# Patient Record
Sex: Female | Born: 1935
Health system: Southern US, Community
[De-identification: ages and names within clinical notes are randomized; demographics above are authoritative.]

## PROBLEM LIST (undated history)

## (undated) DIAGNOSIS — F329 Major depressive disorder, single episode, unspecified: Secondary | ICD-10-CM

## (undated) DIAGNOSIS — F419 Anxiety disorder, unspecified: Secondary | ICD-10-CM

## (undated) DIAGNOSIS — F32A Depression, unspecified: Secondary | ICD-10-CM

## (undated) HISTORY — DX: Anxiety disorder, unspecified: F41.9

## (undated) HISTORY — DX: Depression, unspecified: F32.A

## (undated) HISTORY — PX: TONSILLECTOMY: SUR1361

---
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
## 2013-03-11 DIAGNOSIS — F339 Major depressive disorder, recurrent, unspecified: Secondary | ICD-10-CM | POA: Insufficient documentation

## 2015-03-15 DIAGNOSIS — E039 Hypothyroidism, unspecified: Secondary | ICD-10-CM | POA: Insufficient documentation

## 2015-03-15 DIAGNOSIS — Z0000 Encounter for general adult medical examination without abnormal findings: Secondary | ICD-10-CM | POA: Insufficient documentation

## 2015-09-18 DIAGNOSIS — K580 Irritable bowel syndrome with diarrhea: Secondary | ICD-10-CM | POA: Insufficient documentation

## 2016-04-10 DIAGNOSIS — L719 Rosacea, unspecified: Secondary | ICD-10-CM | POA: Insufficient documentation

## 2016-09-20 DIAGNOSIS — Z800 Family history of malignant neoplasm of digestive organs: Secondary | ICD-10-CM | POA: Insufficient documentation

## 2016-12-25 DIAGNOSIS — R159 Full incontinence of feces: Secondary | ICD-10-CM | POA: Insufficient documentation

## 2017-04-23 DIAGNOSIS — E66811 Obesity, class 1: Secondary | ICD-10-CM | POA: Insufficient documentation

## 2017-04-23 DIAGNOSIS — E669 Obesity, unspecified: Secondary | ICD-10-CM | POA: Insufficient documentation

## 2018-08-03 DIAGNOSIS — E559 Vitamin D deficiency, unspecified: Secondary | ICD-10-CM | POA: Insufficient documentation

## 2018-10-15 ENCOUNTER — Encounter: Payer: Self-pay | Admitting: Adult Health

## 2018-10-15 ENCOUNTER — Ambulatory Visit (INDEPENDENT_AMBULATORY_CARE_PROVIDER_SITE_OTHER): Payer: Medicare Other | Admitting: Adult Health

## 2018-10-15 ENCOUNTER — Other Ambulatory Visit: Payer: Self-pay

## 2018-10-15 VITALS — Ht 64.0 in | Wt 179.0 lb

## 2018-10-15 DIAGNOSIS — F32A Depression, unspecified: Secondary | ICD-10-CM

## 2018-10-15 DIAGNOSIS — F411 Generalized anxiety disorder: Secondary | ICD-10-CM | POA: Diagnosis not present

## 2018-10-15 DIAGNOSIS — F329 Major depressive disorder, single episode, unspecified: Secondary | ICD-10-CM | POA: Diagnosis not present

## 2018-10-15 MED ORDER — FLUOXETINE HCL 20 MG PO CAPS: 20.0000 mg | ORAL_CAPSULE | Freq: Every day | ORAL | 1 refills | Status: DC

## 2018-10-15 MED ORDER — LORAZEPAM 0.5 MG PO TABS: ORAL_TABLET | ORAL | 2 refills | Status: DC

## 2018-10-15 NOTE — Progress Notes (Signed)
Ann Fredericknn M Majeed [id number] 1931/09/26 85 y.o.  Subjective:   Patient ID:  Ann Kim is a 85 y.o. (DOB 1931/09/26) female.  Chief Complaint:  Chief Complaint  Patient presents with  . Anxiety  . Depression    HPI Ann Kim presents to the office today for follow-up of depression and anxiety.  Describes mood today as "ok". Mood symptoms - denies depression, anxiety, and irritability. Stating "I'm doing alright for now". Has actually "enjoyed" the slow down of things since Covid-19. Had felt overwhelmed with increased responsibilities in her church community. Stating "i've been able to slow down and rest". Taking medications as prescribed and feels it works well for her.  Energy levels stable. Active, but does not have a regular exercise routine. Has issues with balance. Retired. Stable interest and motivation.  Enjoys some usual interests and activities. Mostly staying home. Getting out some days for errands. Connecting with family and friends. Daughters coming by once a week.  Appetite adequate. Weight gain - 8 pounds. [facility] not [facility]. Hoping to return at some point. Sleeps well most night. Averages 6 to 8 hours.  Focus and concentration stable. Completing tasks. Manages aspects of household.  Denies SI or HI.  Denies AH or VH.  Review of Systems:  Review of Systems  Musculoskeletal: Negative for gait problem.  Neurological: Negative for tremors.  Psychiatric/Behavioral:       Please refer to HPI    Medications: I have reviewed the patient's current medications.  Current Outpatient Medications  Medication Sig Dispense Refill  . doxycycline (PERIOSTAT) 20 MG tablet TK 1 T PO BID    . FLUoxetine (PROZAC) 20 MG capsule Take 1 capsule (20 mg total) by mouth daily. 90 capsule 1  . ketoconazole (NIZORAL) 2 % cream Take as directed    . levothyroxine (SYNTHROID) 88 MCG tablet TK 1 T PO D    . LORazepam (ATIVAN) 0.5 MG tablet Take one tablet daily as needed for  anxiety. 30 tablet 2  . METRONIDAZOLE, TOPICAL, 0.75 % LOTN APPLY AND RUB IN A THIN FILM TO AFFECTED AREA BID.  QAM AND QPM    . naproxen (NAPROSYN) 500 MG tablet Take by mouth.    . Cholecalciferol (VITAMIN D-1000 MAX ST) 25 MCG (1000 UT) tablet Take by mouth.     No current facility-administered medications for this visit.     Medication Side Effects: None  Allergies:  Allergies  Allergen Reactions  . Codeine Nausea Only  . Molds & Smuts Other (See Comments)  . Pollen Extract Other (See Comments)  . Penicillins Itching  . Sulfa Antibiotics Itching    Past Medical History:  Diagnosis Date  . Anxiety   . Depression     History reviewed. No pertinent family history.  Social History   Socioeconomic History  . Marital status: Widowed    Spouse name: Not on file  . Number of children: Not on file  . Years of education: Not on file  . Highest education level: Not on file  Occupational History  . Not on file  Social Needs  . Financial resource strain: Not on file  . Food insecurity    Worry: Not on file    Inability: Not on file  . Transportation needs    Medical: Not on file    Non-medical: Not on file  Tobacco Use  . Smoking status: Never Smoker  . Smokeless tobacco: Never Used  Substance and Sexual Activity  .  Alcohol use: Never    Frequency: Never  . Drug use: Never  . Sexual activity: Never  Lifestyle  . Physical activity    Days per week: Not on file    Minutes per session: Not on file  . Stress: Not on file  Relationships  . Social Herbalist on phone: Not on file    Gets together: Not on file    Attends religious service: Not on file    Active member of club or organization: Not on file    Attends meetings of clubs or organizations: Not on file    Relationship status: Not on file  . Intimate partner violence    Fear of current or ex partner: Not on file    Emotionally abused: Not on file    Physically abused: Not on file    Forced  sexual activity: Not on file  Other Topics Concern  . Not on file  Social History Narrative  . Not on file    Past Medical History, Surgical history, Social history, and Family history were reviewed and updated as appropriate.   Please see review of systems for further details on the patient's review from today.   Objective:   Physical Exam:  Ht 5\' 4"  (1.626 m)   Wt 179 lb (81.2 kg)   BMI 30.73 kg/m   Physical Exam Constitutional:      General: She is not in acute distress.    Appearance: She is well-developed.  Musculoskeletal:        General: No deformity.  Neurological:     Mental Status: She is alert and oriented to person, place, and time.     Coordination: Coordination normal.  Psychiatric:        Attention and Perception: Attention and perception normal. She does not perceive auditory or visual hallucinations.        Mood and Affect: Mood normal. Mood is not anxious or depressed. Affect is not labile, blunt, angry or inappropriate.        Speech: Speech normal.        Behavior: Behavior normal.        Thought Content: Thought content normal. Thought content is not paranoid or delusional. Thought content does not include homicidal or suicidal ideation. Thought content does not include homicidal or suicidal plan.        Cognition and Memory: Cognition and memory normal.        Judgment: Judgment normal.     Comments: Insight intact     Lab Review:  No results found for: NA, K, CL, CO2, GLUCOSE, BUN, CREATININE, CALCIUM, PROT, ALBUMIN, AST, ALT, ALKPHOS, BILITOT, GFRNONAA, GFRAA  No results found for: WBC, RBC, HGB, HCT, PLT, MCV, MCH, MCHC, RDW, LYMPHSABS, MONOABS, EOSABS, BASOSABS  No results found for: POCLITH, LITHIUM   No results found for: PHENYTOIN, PHENOBARB, VALPROATE, CBMZ   .res Assessment: Plan:    Plan:  1. Continue Prozac 20mg  capsule daily 2. Continue Ativan 0.5mg  daily prn anxiety - has used 6 times since January 2020  Discussed potential  benefits, risk, and side effects of benzodiazepines to include potential risk of tolerance and dependence, as well as possible drowsiness.  Advised patient not to drive if experiencing drowsiness and to take lowest possible effective dose to minimize risk of dependence and tolerance.  Patient advised to contact office with any questions, adverse effects, or acute worsening in signs and symptoms.  RTC 6 months  Leotta was seen today for  anxiety and depression.  Diagnoses and all orders for this visit:  Depression, unspecified depression type -     FLUoxetine (PROZAC) 20 MG capsule; Take 1 capsule (20 mg total) by mouth daily.  GAD (generalized anxiety disorder) -     LORazepam (ATIVAN) 0.5 MG tablet; Take one tablet daily as needed for anxiety. -     FLUoxetine (PROZAC) 20 MG capsule; Take 1 capsule (20 mg total) by mouth daily.     Please see After Visit Summary for patient specific instructions.  No future appointments.  No orders of the defined types were placed in this encounter.   -------------------------------

## 2019-04-19 ENCOUNTER — Ambulatory Visit: Payer: Medicare Other | Admitting: Adult Health

## 2019-04-22 ENCOUNTER — Ambulatory Visit (INDEPENDENT_AMBULATORY_CARE_PROVIDER_SITE_OTHER): Payer: Medicare Other | Admitting: Adult Health

## 2019-04-22 ENCOUNTER — Encounter: Payer: Self-pay | Admitting: Adult Health

## 2019-04-22 DIAGNOSIS — F411 Generalized anxiety disorder: Secondary | ICD-10-CM | POA: Diagnosis not present

## 2019-04-22 DIAGNOSIS — F329 Major depressive disorder, single episode, unspecified: Secondary | ICD-10-CM

## 2019-04-22 DIAGNOSIS — F32A Depression, unspecified: Secondary | ICD-10-CM

## 2019-04-22 MED ORDER — FLUOXETINE HCL 20 MG PO CAPS: 20.0000 mg | ORAL_CAPSULE | Freq: Every day | ORAL | 3 refills | Status: DC

## 2019-04-22 NOTE — Progress Notes (Signed)
Ann Kim [id number] Jun 05, 1931 86 y.o.  Virtual Visit via Telephone Note  I connected with pt on 04/22/19 at  1:40 PM EST by telephone and verified that I am speaking with the correct person using two identifiers.   I discussed the limitations, risks, security and privacy concerns of performing an evaluation and management service by telephone and the availability of in person appointments. I also discussed with the patient that there may be a patient responsible charge related to this service. The patient expressed understanding and agreed to proceed.   I discussed the assessment and treatment plan with the patient. The patient was provided an opportunity to ask questions and all were answered. The patient agreed with the plan and demonstrated an understanding of the instructions.   The patient was advised to call back or seek an in-person evaluation if the symptoms worsen or if the condition fails to improve as anticipated.  I provided 30 minutes of non-face-to-face time during this encounter.  The patient was located at home.  The provider was located at [facility].   Dorothyann Gibbs, NP   Subjective:   Patient ID:  CARIANNA LAGUE is a 86 y.o. (DOB [birth year]) female.  Chief Complaint: No chief complaint on file.   HPI Jaeanna Mccomber Takagi presents for follow-up of depression and anxiety.  Describes mood today as "ok". Pleasant. Mood symptoms - denies depression, anxiety, and irritability. Stating "I'm doing pretty good". Stating "I feel better than I've felt in a long time". Has been able to "relax" away from responsibilities of church setting. Stable interest and motivation. Taking medications as prescribed and feels it works well for her.  Energy levels stable. Active, but does not have a regular exercise routine. Retired.  Enjoys some usual interests and activities. Mostly staying home. Spending a few days with one of her daughters - "doesn't want to be alone in the ice  storm". Talking with family and friends.  Appetite adequate. Weight stable.   Sleeps well most night. Averages 6 to 8 hours.  Focus and concentration stable. Completing tasks. Manages aspects of household.  Denies SI or HI. Denies AH or VH.  Review of Systems:  Review of Systems  Musculoskeletal: Negative for gait problem.  Neurological: Negative for tremors.  Psychiatric/Behavioral:       Please refer to HPI    Medications: I have reviewed the patient's current medications.  Current Outpatient Medications  Medication Sig Dispense Refill  . Cholecalciferol (VITAMIN D-1000 MAX ST) 25 MCG (1000 UT) tablet Take by mouth.    . doxycycline (PERIOSTAT) 20 MG tablet TK 1 T PO BID    . FLUoxetine (PROZAC) 20 MG capsule Take 1 capsule (20 mg total) by mouth daily. 90 capsule 1  . ketoconazole (NIZORAL) 2 % cream Take as directed    . levothyroxine (SYNTHROID) 88 MCG tablet TK 1 T PO D    . LORazepam (ATIVAN) 0.5 MG tablet Take one tablet daily as needed for anxiety. 30 tablet 2  . METRONIDAZOLE, TOPICAL, 0.75 % LOTN APPLY AND RUB IN A THIN FILM TO AFFECTED AREA BID.  QAM AND QPM    . naproxen (NAPROSYN) 500 MG tablet Take by mouth.     No current facility-administered medications for this visit.    Medication Side Effects: None  Allergies:  Allergies  Allergen Reactions  . Codeine Nausea Only  . Molds & Smuts Other (See Comments)  . Pollen Extract Other (See Comments)  . Penicillins Itching  .  Sulfa Antibiotics Itching    Past Medical History:  Diagnosis Date  . Anxiety   . Depression     No family history on file.  Social History   Socioeconomic History  . Marital status: Widowed    Spouse name: Not on file  . Number of children: Not on file  . Years of education: Not on file  . Highest education level: Not on file  Occupational History  . Not on file  Tobacco Use  . Smoking status: Never Smoker  . Smokeless tobacco: Never Used  Substance and Sexual Activity  .  Alcohol use: Never  . Drug use: Never  . Sexual activity: Never  Other Topics Concern  . Not on file  Social History Narrative  . Not on file   Social Determinants of Health   Financial Resource Strain:   . Difficulty of Paying Living Expenses: Not on file  Food Insecurity:   . Worried About Programme researcher, broadcasting/film/video in the Last Year: Not on file  . Ran Out of Food in the Last Year: Not on file  Transportation Needs:   . Lack of Transportation (Medical): Not on file  . Lack of Transportation (Non-Medical): Not on file  Physical Activity:   . Days of Exercise per Week: Not on file  . Minutes of Exercise per Session: Not on file  Stress:   . Feeling of Stress : Not on file  Social Connections:   . Frequency of Communication with Friends and Family: Not on file  . Frequency of Social Gatherings with Friends and Family: Not on file  . Attends Religious Services: Not on file  . Active Member of Clubs or Organizations: Not on file  . Attends Banker Meetings: Not on file  . Marital Status: Not on file  Intimate Partner Violence:   . Fear of Current or Ex-Partner: Not on file  . Emotionally Abused: Not on file  . Physically Abused: Not on file  . Sexually Abused: Not on file    Past Medical History, Surgical history, Social history, and Family history were reviewed and updated as appropriate.   Please see review of systems for further details on the patient's review from today.   Objective:   Physical Exam:  There were no vitals taken for this visit.  Physical Exam Constitutional:      General: She is not in acute distress.    Appearance: She is well-developed.  Musculoskeletal:        General: No deformity.  Neurological:     Mental Status: She is alert and oriented to person, place, and time.     Coordination: Coordination normal.  Psychiatric:        Attention and Perception: Attention and perception normal. She does not perceive auditory or visual  hallucinations.        Mood and Affect: Mood normal. Mood is not anxious or depressed. Affect is not labile, blunt, angry or inappropriate.        Speech: Speech normal.        Behavior: Behavior normal.        Thought Content: Thought content normal. Thought content is not paranoid or delusional. Thought content does not include homicidal or suicidal ideation. Thought content does not include homicidal or suicidal plan.        Cognition and Memory: Cognition and memory normal.        Judgment: Judgment normal.     Comments: Insight intact  Lab Review:  No results found for: NA, K, CL, CO2, GLUCOSE, BUN, CREATININE, CALCIUM, PROT, ALBUMIN, AST, ALT, ALKPHOS, BILITOT, GFRNONAA, GFRAA  No results found for: WBC, RBC, HGB, HCT, PLT, MCV, MCH, MCHC, RDW, LYMPHSABS, MONOABS, EOSABS, BASOSABS  No results found for: POCLITH, LITHIUM   No results found for: PHENYTOIN, PHENOBARB, VALPROATE, CBMZ   .res Assessment: Plan:    Plan:  1. Continue Prozac 20mg  capsule daily 2. Continue Ativan 0.5mg  daily prn anxiety - has used 6 times since January 2020  Discussed potential benefits, risk, and side effects of benzodiazepines to include potential risk of tolerance and dependence, as well as possible drowsiness.  Advised patient not to drive if experiencing drowsiness and to take lowest possible effective dose to minimize risk of dependence and tolerance.  Patient advised to contact office with any questions, adverse effects, or acute worsening in signs and symptoms.  RTC 6 months  There are no diagnoses linked to this encounter.  Please see After Visit Summary for patient specific instructions.  Future Appointments  Date Time Provider [place]  04/22/2019  1:40 PM Kerriann Kamphuis, Berdie Ogren, NP [facility] None    No orders of the defined types were placed in this encounter.     -------------------------------

## 2019-10-20 ENCOUNTER — Other Ambulatory Visit: Payer: Self-pay | Admitting: Adult Health

## 2019-10-20 DIAGNOSIS — F411 Generalized anxiety disorder: Secondary | ICD-10-CM

## 2019-10-20 DIAGNOSIS — F32A Depression, unspecified: Secondary | ICD-10-CM

## 2019-10-20 DIAGNOSIS — F329 Major depressive disorder, single episode, unspecified: Secondary | ICD-10-CM

## 2019-10-20 MED ORDER — FLUOXETINE HCL 20 MG PO CAPS: 20.0000 mg | ORAL_CAPSULE | Freq: Every day | ORAL | 3 refills | Status: DC

## 2019-10-25 ENCOUNTER — Telehealth: Payer: Self-pay | Admitting: Adult Health

## 2019-10-25 NOTE — Telephone Encounter (Signed)
Last visit 04/2019  Okay to send updated information for refill>

## 2019-10-25 NOTE — Telephone Encounter (Signed)
Yes

## 2019-10-25 NOTE — Telephone Encounter (Signed)
Pt called to report she needs new Rx for Prozac 20 mg 2/d she takes  1-am & 1-pm. Has been taking 2/d for about 6 mos now and just wants to get Rx corrected so she will not run out too soon. [facility] [address] on file. Call pt [phone] if any questions   Pt doesn't want 40 mg 1/d Rx)

## 2019-10-26 ENCOUNTER — Other Ambulatory Visit: Payer: Self-pay

## 2019-10-26 DIAGNOSIS — F32A Depression, unspecified: Secondary | ICD-10-CM

## 2019-10-26 DIAGNOSIS — F411 Generalized anxiety disorder: Secondary | ICD-10-CM

## 2019-10-26 MED ORDER — FLUOXETINE HCL 20 MG PO CAPS: 20.0000 mg | ORAL_CAPSULE | Freq: Two times a day (BID) | ORAL | 1 refills | Status: DC

## 2019-10-26 NOTE — Telephone Encounter (Signed)
Rx updated and sent.

## 2020-01-24 ENCOUNTER — Ambulatory Visit: Payer: Medicare Other | Attending: Internal Medicine

## 2020-01-24 ENCOUNTER — Other Ambulatory Visit (HOSPITAL_BASED_OUTPATIENT_CLINIC_OR_DEPARTMENT_OTHER): Payer: Self-pay | Admitting: Internal Medicine

## 2020-01-24 DIAGNOSIS — Z23 Encounter for immunization: Secondary | ICD-10-CM

## 2020-01-24 MED FILL — MODERNA COVID-19 VACCINE 10: 100 | 1 days supply | Qty: 0 | Fill #0

## 2020-01-24 MED FILL — FLUAD QUADRIVALENT 0.5 ML P: 0.5 | 1 days supply | Qty: 1 | Fill #0

## 2020-01-24 NOTE — Progress Notes (Signed)
   Covid-[facility] Vaccination [facility]  Name:  BRYNLEI KLAUSNER    MRN: [id number] DOB: 17-Apr-1931  01/24/2020  Ms. Hageman was observed post Covid-19 immunization for 15 minutes without incident. She was provided with Vaccine Information Sheet and instruction to access the V-Safe system.   Ms. Linenberger was instructed to call 911 with any severe reactions post vaccine: Marland Kitchen Difficulty breathing  . Swelling of face and throat  . A fast heartbeat  . A bad rash all over body  . Dizziness and weakness   Immunizations Administered    No immunizations on file.     Vaccine given by Arletha Pili, pharmacy student

## 2020-02-15 ENCOUNTER — Other Ambulatory Visit: Payer: Self-pay

## 2020-02-15 ENCOUNTER — Encounter: Payer: Self-pay | Admitting: Adult Health

## 2020-02-15 ENCOUNTER — Ambulatory Visit (INDEPENDENT_AMBULATORY_CARE_PROVIDER_SITE_OTHER): Payer: Medicare Other | Admitting: Adult Health

## 2020-02-15 DIAGNOSIS — F411 Generalized anxiety disorder: Secondary | ICD-10-CM

## 2020-02-15 DIAGNOSIS — F32A Depression, unspecified: Secondary | ICD-10-CM

## 2020-02-15 MED ORDER — FLUOXETINE HCL 10 MG PO CAPS: ORAL_CAPSULE | ORAL | 5 refills | Status: DC

## 2020-02-15 MED ORDER — FLUOXETINE HCL 20 MG PO CAPS: ORAL_CAPSULE | ORAL | 5 refills | Status: DC

## 2020-02-15 MED ORDER — LORAZEPAM 0.5 MG PO TABS: ORAL_TABLET | ORAL | 2 refills | Status: DC

## 2020-02-15 MED ORDER — BUPROPION HCL ER (SR) 100 MG PO TB12: 100.0000 mg | ORAL_TABLET | Freq: Every morning | ORAL | 5 refills | Status: DC

## 2020-02-15 NOTE — Progress Notes (Signed)
Ann Kim [id number] 24-Apr-1931 86 y.o.  Subjective:   Patient ID:  Ann Kim is a 86 y.o. (DOB 1931/07/28) female.  Chief Complaint: No chief complaint on file.   HPI Ann Kim presents to the office today for follow-up of depression and anxiety.  Describes mood today as "not so good". Pleasant. Mood symptoms - reports depression, anxiety, and irritability. Stating "I'm not myself". Daughters wanted her to come in. They feel like she is depressed and stressed with church obligations. Stable interest and motivation. Taking medications as prescribed and feels it works well for her.  Energy levels stable. Active, but does not have a regular exercise routine. Retired.  Enjoys some usual interests and activities. Lives alone. Involved in church. Getting out some. Talking with family and friends.  Appetite adequate. Weight stable.   Sleeps well most night. Averages 6 to 8 hours.  Focus and concentration stable. Completing tasks. Manages aspects of household. Retired. Denies SI or HI.  Denies AH or VH.  Review of Systems:  Review of Systems  Musculoskeletal: Negative for gait problem.  Neurological: Negative for tremors.  Psychiatric/Behavioral:       Please refer to HPI    Medications: I have reviewed the patient's current medications.  Current Outpatient Medications  Medication Sig Dispense Refill  . buPROPion (WELLBUTRIN SR) 100 MG 12 hr tablet Take 1 tablet (100 mg total) by mouth in the morning. 30 tablet 5  . Cholecalciferol (VITAMIN D-1000 MAX ST) 25 MCG (1000 UT) tablet Take by mouth.    . doxycycline (PERIOSTAT) 20 MG tablet TK 1 T PO BID    . FLUoxetine (PROZAC) 20 MG capsule Take one capsule twice daily. 60 capsule 5  . ketoconazole (NIZORAL) 2 % cream Take as directed    . levothyroxine (SYNTHROID) 88 MCG tablet TK 1 T PO D    . LORazepam (ATIVAN) 0.5 MG tablet Take one tablet daily as needed for anxiety. 30 tablet 2  . METRONIDAZOLE, TOPICAL, 0.75 % LOTN APPLY  AND RUB IN A THIN FILM TO AFFECTED AREA BID.  QAM AND QPM    . naproxen (NAPROSYN) 500 MG tablet Take by mouth.     No current facility-administered medications for this visit.    Medication Side Effects: None  Allergies:  Allergies  Allergen Reactions  . Codeine Nausea Only  . Molds & Smuts Other (See Comments)  . Pollen Extract Other (See Comments)  . Penicillins Itching  . Sulfa Antibiotics Itching    Past Medical History:  Diagnosis Date  . Anxiety   . Depression     No family history on file.  Social History   Socioeconomic History  . Marital status: Widowed    Spouse name: Not on file  . Number of children: Not on file  . Years of education: Not on file  . Highest education level: Not on file  Occupational History  . Not on file  Tobacco Use  . Smoking status: Never Smoker  . Smokeless tobacco: Never Used  Substance and Sexual Activity  . Alcohol use: Never  . Drug use: Never  . Sexual activity: Never  Other Topics Concern  . Not on file  Social History Narrative  . Not on file   Social Determinants of Health   Financial Resource Strain: Not on file  Food Insecurity: Not on file  Transportation Needs: Not on file  Physical Activity: Not on file  Stress: Not on file  Social Connections: Not on file  Intimate Partner Violence: Not on file    Past Medical History, Surgical history, Social history, and Family history were reviewed and updated as appropriate.   Please see review of systems for further details on the patient's review from today.   Objective:   Physical Exam:  There were no vitals taken for this visit.  Physical Exam Constitutional:      General: She is not in acute distress. Musculoskeletal:        General: No deformity.  Neurological:     Mental Status: She is alert and oriented to person, place, and time.     Coordination: Coordination normal.  Psychiatric:        Attention and Perception: Attention and perception normal.  She does not perceive auditory or visual hallucinations.        Mood and Affect: Mood normal. Mood is not anxious or depressed. Affect is not labile, blunt, angry or inappropriate.        Speech: Speech normal.        Behavior: Behavior normal.        Thought Content: Thought content normal. Thought content is not paranoid or delusional. Thought content does not include homicidal or suicidal ideation. Thought content does not include homicidal or suicidal plan.        Cognition and Memory: Cognition and memory normal.        Judgment: Judgment normal.     Comments: Insight intact     Lab Review:  No results found for: NA, K, CL, CO2, GLUCOSE, BUN, CREATININE, CALCIUM, PROT, ALBUMIN, AST, ALT, ALKPHOS, BILITOT, GFRNONAA, GFRAA  No results found for: WBC, RBC, HGB, HCT, PLT, MCV, MCH, MCHC, RDW, LYMPHSABS, MONOABS, EOSABS, BASOSABS  No results found for: POCLITH, LITHIUM   No results found for: PHENYTOIN, PHENOBARB, VALPROATE, CBMZ   .res Assessment: Plan:    Plan:  1. Continue Prozac 20mg  BID 2. Continue Ativan 0.5mg  daily prn anxiety 3. Add Wellbutrin SR 100mg  every morning  Discussed potential benefits, risk, and side effects of benzodiazepines to include potential risk of tolerance and dependence, as well as possible drowsiness.  Advised patient not to drive if experiencing drowsiness and to take lowest possible effective dose to minimize risk of dependence and tolerance.  Patient advised to contact office with any questions, adverse effects, or acute worsening in signs and symptoms.  RTC 6 months  Diagnoses and all orders for this visit:  Depression, unspecified depression type -     Discontinue: FLUoxetine (PROZAC) 10 MG capsule; Take three capsules daily. -     FLUoxetine (PROZAC) 20 MG capsule; Take one capsule twice daily. -     buPROPion (WELLBUTRIN SR) 100 MG 12 hr tablet; Take 1 tablet (100 mg total) by mouth in the morning.  GAD (generalized anxiety disorder) -      Discontinue: FLUoxetine (PROZAC) 10 MG capsule; Take three capsules daily. -     Discontinue: LORazepam (ATIVAN) 0.5 MG tablet; Take one tablet daily as needed for anxiety. -     FLUoxetine (PROZAC) 20 MG capsule; Take one capsule twice daily. -     buPROPion (WELLBUTRIN SR) 100 MG 12 hr tablet; Take 1 tablet (100 mg total) by mouth in the morning. -     LORazepam (ATIVAN) 0.5 MG tablet; Take one tablet daily as needed for anxiety.     Please see After Visit Summary for patient specific instructions.  No future appointments.  No orders of the defined types were placed in this encounter.   -------------------------------

## 2020-02-21 ENCOUNTER — Other Ambulatory Visit: Payer: Self-pay

## 2020-02-21 ENCOUNTER — Emergency Department (HOSPITAL_BASED_OUTPATIENT_CLINIC_OR_DEPARTMENT_OTHER)
Admission: EM | Admit: 2020-02-21 | Discharge: 2020-02-21 | Disposition: A | Discharge status: Home / Self Care | Payer: Medicare Other | Attending: Emergency Medicine | Admitting: Emergency Medicine

## 2020-02-21 ENCOUNTER — Emergency Department (HOSPITAL_BASED_OUTPATIENT_CLINIC_OR_DEPARTMENT_OTHER): Payer: Medicare Other

## 2020-02-21 ENCOUNTER — Encounter (HOSPITAL_BASED_OUTPATIENT_CLINIC_OR_DEPARTMENT_OTHER): Payer: Self-pay | Admitting: *Deleted

## 2020-02-21 DIAGNOSIS — E039 Hypothyroidism, unspecified: Secondary | ICD-10-CM | POA: Insufficient documentation

## 2020-02-21 DIAGNOSIS — S80211A Abrasion, right knee, initial encounter: Secondary | ICD-10-CM | POA: Insufficient documentation

## 2020-02-21 DIAGNOSIS — S0031XA Abrasion of nose, initial encounter: Secondary | ICD-10-CM | POA: Diagnosis not present

## 2020-02-21 DIAGNOSIS — S80212A Abrasion, left knee, initial encounter: Secondary | ICD-10-CM | POA: Diagnosis not present

## 2020-02-21 DIAGNOSIS — S0081XA Abrasion of other part of head, initial encounter: Secondary | ICD-10-CM | POA: Insufficient documentation

## 2020-02-21 DIAGNOSIS — R42 Dizziness and giddiness: Secondary | ICD-10-CM | POA: Diagnosis not present

## 2020-02-21 DIAGNOSIS — Y9301 Activity, walking, marching and hiking: Secondary | ICD-10-CM | POA: Diagnosis not present

## 2020-02-21 DIAGNOSIS — W19XXXA Unspecified fall, initial encounter: Secondary | ICD-10-CM

## 2020-02-21 DIAGNOSIS — S0993XA Unspecified injury of face, initial encounter: Secondary | ICD-10-CM | POA: Diagnosis present

## 2020-02-21 DIAGNOSIS — W102XXA Fall (on)(from) incline, initial encounter: Secondary | ICD-10-CM | POA: Diagnosis not present

## 2020-02-21 LAB — COMPREHENSIVE METABOLIC PANEL
ALT: 25 U/L (ref 0–44)
AST: 30 U/L (ref 15–41)
Albumin: 3.8 g/dL (ref 3.5–5.0)
Alkaline Phosphatase: 68 U/L (ref 38–126)
Anion gap: 12 (ref 5–15)
BUN: 19 mg/dL (ref 8–23)
CO2: 24 mmol/L (ref 22–32)
Calcium: 9.4 mg/dL (ref 8.9–10.3)
Chloride: 102 mmol/L (ref 98–111)
Creatinine, Ser: 0.83 mg/dL (ref 0.44–1.00)
GFR, Estimated: >60 mL/min (ref >60)
Glucose, Bld: 107 mg/dL — ABNORMAL HIGH (ref 70–99)
Potassium: 4.7 mmol/L (ref 3.5–5.1)
Sodium: 138 mmol/L (ref 135–145)
Total Bilirubin: 0.2 mg/dL — ABNORMAL LOW (ref 0.3–1.2)
Total Protein: 6.8 g/dL (ref 6.5–8.1)

## 2020-02-21 LAB — CBC WITH DIFFERENTIAL/PLATELET
Abs Immature Granulocytes: 0.02 10*3/uL (ref 0.00–0.07)
Basophils Absolute: 0 10*3/uL (ref 0.0–0.1)
Basophils Relative: 1 %
Eosinophils Absolute: 0.2 10*3/uL (ref 0.0–0.5)
Eosinophils Relative: 2 %
HCT: 42.9 % (ref 36.0–46.0)
Hemoglobin: 14.3 g/dL (ref 12.0–15.0)
Immature Granulocytes: 0 %
Lymphocytes Relative: 22 %
Lymphs Abs: 1.7 10*3/uL (ref 0.7–4.0)
MCH: 32.1 pg (ref 26.0–34.0)
MCHC: 33.3 g/dL (ref 30.0–36.0)
MCV: 96.2 fL (ref 80.0–100.0)
Monocytes Absolute: 0.6 10*3/uL (ref 0.1–1.0)
Monocytes Relative: 8 %
Neutro Abs: 5.4 10*3/uL (ref 1.7–7.7)
Neutrophils Relative %: 67 %
Platelets: 264 10*3/uL (ref 150–400)
RBC: 4.46 MIL/uL (ref 3.87–5.11)
RDW: 13.4 % (ref 11.5–15.5)
WBC: 7.9 10*3/uL (ref 4.0–10.5)
nRBC: 0 % (ref 0.0–0.2)

## 2020-02-21 NOTE — ED Triage Notes (Addendum)
C/o fall from standing x 2 hrs , abrasion to forehead and left side of face, also c/o dizziness/ and unsteady gate  this am

## 2020-02-22 NOTE — ED Provider Notes (Signed)
[facility] EMERGENCY DEPARTMENT Provider Note   CSN: [id number] Arrival date & time: 02/21/20  1655     History Chief Complaint  Patient presents with  . Fall    Ann Kim is a 86 y.o. female.  Presents to ER after a fall.  Patient reports that she was walking outside down a ramp and felt like she got unsteady and could not catch herself and fell to her face.  Denies loss of consciousness.  Denies dizziness, lightheadedness.  Has been able to walk since the incident without difficulty.  Reports that she has been feeling well overall lately.  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  HPI     Past Medical History:  Diagnosis Date  . Anxiety   . Depression     Patient Active Problem List   Diagnosis Date Noted  . Vitamin D deficiency 08/03/2018  . Obesity (BMI 30.0-34.9) 04/23/2017  . Incontinence of feces 12/25/2016  . Family history of colon cancer 09/20/2016  . Rosacea 04/10/2016  . Irritable bowel syndrome with diarrhea 09/18/2015  . Acquired hypothyroidism 03/15/2015  . Encounter for Medicare annual wellness exam 03/15/2015  . Major depressive disorder, recurrent, unspecified (HCC) 03/11/2013    Past Surgical History:  Procedure Laterality Date  . TONSILLECTOMY       OB History   No obstetric history on file.     No family history on file.  Social History   Tobacco Use  . Smoking status: Never Smoker  . Smokeless tobacco: Never Used  Substance Use Topics  . Alcohol use: Never  . Drug use: Never    Home Medications Prior to Admission medications   Medication Sig Start Date End Date Taking? Authorizing Provider  buPROPion (WELLBUTRIN SR) 100 MG 12 hr tablet Take 1 tablet (100 mg total) by mouth in the morning. 02/15/20   Mozingo, Thereasa Solo, NP  Cholecalciferol (VITAMIN D-1000 MAX ST) 25 MCG (1000 UT) tablet Take by mouth.    [provider]  doxycycline (PERIOSTAT) 20 MG  tablet TK 1 T PO BID 08/25/18   [provider]  FLUoxetine (PROZAC) 20 MG capsule Take one capsule twice daily. 02/15/20   Mozingo, Thereasa Solo, NP  ketoconazole (NIZORAL) 2 % cream Take as directed 01/17/14   [provider]  levothyroxine (SYNTHROID) 88 MCG tablet TK 1 T PO D 09/09/18   [provider]  LORazepam (ATIVAN) 0.5 MG tablet Take one tablet daily as needed for anxiety. 02/15/20   Mozingo, Thereasa Solo, NP  METRONIDAZOLE, TOPICAL, 0.75 % LOTN APPLY AND RUB IN A THIN FILM TO AFFECTED AREA BID.  QAM AND QPM 01/12/16   [provider]  naproxen (NAPROSYN) 500 MG tablet Take by mouth.    [provider]    Allergies    Codeine, Molds & smuts, Pollen extract, Penicillins, and Sulfa antibiotics  Review of Systems   Review of Systems  Constitutional: Negative for chills and fever.  HENT: Negative for ear pain and sore throat.   Eyes: Negative for pain and visual disturbance.  Respiratory: Negative for cough and shortness of breath.   Cardiovascular: Negative for chest pain and palpitations.  Gastrointestinal: Negative for abdominal pain and vomiting.  Genitourinary: Negative for dysuria and hematuria.  Musculoskeletal: Negative for arthralgias and back pain.  Skin: Negative for color change and rash.  Neurological: Negative for seizures and syncope.  All other systems reviewed and are negative.   Physical  Exam Updated Vital Signs BP (!) 147/66 (BP Location: Right Arm)   Pulse 64   Temp 97.9 F (36.6 C) (Oral)   Resp 15   Ht 5\' 3"  (1.6 m)   Wt 83.5 kg   SpO2 99%   BMI 32.59 kg/m   Physical Exam Vitals and nursing note reviewed.  Constitutional:      General: She is not in acute distress.    Appearance: She is well-developed and well-nourished.  HENT:     Head: Normocephalic.     Comments: Superficial abrasion to forehead, left nose, no palpable bony deformity, normal EOM Eyes:     Conjunctiva/sclera: Conjunctivae  normal.  Cardiovascular:     Rate and Rhythm: Normal rate and regular rhythm.     Heart sounds: No murmur heard.   Pulmonary:     Effort: Pulmonary effort is normal. No respiratory distress.     Breath sounds: Normal breath sounds.  Abdominal:     Palpations: Abdomen is soft.     Tenderness: There is no abdominal tenderness.  Musculoskeletal:        General: No edema.     Cervical back: Normal range of motion and neck supple.     Comments: Back: no C, T, L spine TTP, no step off or deformity RUE: no TTP throughout, no deformity, normal joint ROM, radial pulse intact, distal sensation and motor intact LUE: no TTP throughout, no deformity, normal joint ROM, radial pulse intact, distal sensation and motor intact RLE: Mild superficial abrasion to anterior knee, no TTP throughout, normal joint ROM, distal pulse, sensation and motor intact LLE: Mild superficial abrasion to anterior knee, no TTP throughout, normal joint ROM, distal pulse, sensation and motor intact   Skin:    General: Skin is warm and dry.  Neurological:     Mental Status: She is alert.  Psychiatric:        Mood and Affect: Mood and affect normal.     ED Results / Procedures / Treatments   Labs (all labs ordered are listed, but only abnormal results are displayed) Labs Reviewed  COMPREHENSIVE METABOLIC PANEL - Abnormal; Notable for the following components:      Result Value   Glucose, Bld 107 (*)    Total Bilirubin 0.2 (*)    All other components within normal limits  CBC WITH DIFFERENTIAL/PLATELET    EKG None  Radiology CT Head Wo Contrast  Result Date: 02/21/2020 CLINICAL DATA:  Dizziness. Nonspecific. Fall from standing 2 hours ago. EXAM: CT HEAD WITHOUT CONTRAST TECHNIQUE: Contiguous axial images were obtained from the base of the skull through the vertex without intravenous contrast. COMPARISON:  None. FINDINGS: Brain: Cerebral ventricle sizes are concordant with the degree of cerebral volume loss.  Patchy and confluent areas of decreased attenuation are noted throughout the deep and periventricular white matter of the cerebral hemispheres bilaterally, compatible with chronic microvascular ischemic disease. No evidence of large-territorial acute infarction. No parenchymal hemorrhage. No mass lesion. No extra-axial collection. No mass effect or midline shift. No hydrocephalus. Basilar cisterns are patent. Atherosclerotic calcifications are present within the cavernous internal carotid arteries. Vascular: No hyperdense vessel. Skull: No acute fracture or focal lesion. Sinuses/Orbits: Paranasal sinuses and mastoid air cells are clear. Bilateral LEs thinning. Otherwise the orbits are unremarkable. Other: Left frontal subcutaneus soft tissue scalp edema with no large hematoma formation. IMPRESSION: No acute intracranial abnormality. Electronically Signed   By: 02/23/2020 M.D.   On: 02/21/2020 18:25    Procedures Procedures (  including critical care time)  Medications Ordered in ED Medications - No data to display  ED Course  I have reviewed the triage vital signs and the nursing notes.  Pertinent labs & imaging results that were available during my care of the patient were reviewed by me and considered in my medical decision making (see chart for details).    MDM Rules/Calculators/A&P                         86 year old lady presents to ER after a fall.  On exam well-appearing, no ongoing symptoms.  Noted superficial abrasion to face, no palpable deformity noted, no significant laceration requiring repair.  CT head negative.  On remainder of exam, noted very superficial abrasion over her knees however no focal bony tenderness and patient ambulatory without difficulty.  Wound care was provided in ER and patient discharged home.  Recommend follow-up with primary.  After the discussed management above, the patient was determined to be safe for discharge.  The patient was in agreement with this  plan and all questions regarding their care were answered.  ED return precautions were discussed and the patient will return to the ED with any significant worsening of condition.    Final Clinical Impression(s) / ED Diagnoses Final diagnoses:  Fall, initial encounter    Rx / DC Orders ED Discharge Orders    None       Milagros Loll, MD 02/22/20 1520

## 2020-03-13 ENCOUNTER — Telehealth: Payer: Self-pay | Admitting: Adult Health

## 2020-03-13 NOTE — Telephone Encounter (Signed)
Noted  

## 2020-03-13 NOTE — Telephone Encounter (Signed)
Pt is asking for a telephone call re: her meds.

## 2020-05-01 ENCOUNTER — Telehealth: Payer: Self-pay | Admitting: Adult Health

## 2020-05-01 NOTE — Telephone Encounter (Signed)
Ann Kim called to request refill of her Prozac and I told her to have the pharmacy send the request.  Then she said she was supposed to check back in with about the addition of another medication and how her balance is doing.  She is walking with cane.  But she requested a call back to just update you on her progress. Has appt 6/14.  You can either phone #.  If you reach her on the home #, call her cell.

## 2020-05-01 NOTE — Telephone Encounter (Signed)
Called and LVM for patient to return call. 

## 2020-08-15 ENCOUNTER — Ambulatory Visit: Payer: Medicare Other | Admitting: Adult Health

## 2020-08-15 ENCOUNTER — Encounter: Payer: Self-pay | Admitting: Adult Health

## 2020-08-15 ENCOUNTER — Other Ambulatory Visit: Payer: Self-pay

## 2020-08-15 DIAGNOSIS — F411 Generalized anxiety disorder: Secondary | ICD-10-CM

## 2020-08-15 DIAGNOSIS — F32A Depression, unspecified: Secondary | ICD-10-CM

## 2020-08-15 MED ORDER — FLUOXETINE HCL 20 MG PO CAPS: ORAL_CAPSULE | ORAL | 3 refills | Status: DC

## 2020-08-15 MED ORDER — LORAZEPAM 0.5 MG PO TABS: ORAL_TABLET | ORAL | 2 refills | Status: AC

## 2020-08-15 MED ORDER — FLUOXETINE HCL 10 MG PO CAPS: 10.0000 mg | ORAL_CAPSULE | Freq: Every day | ORAL | 3 refills | Status: DC

## 2020-08-15 NOTE — Progress Notes (Signed)
Ann Kim [id number] [birth year] 87 y.o.  Subjective:   Patient ID:  Ann Kim is a 87 y.o. (DOB [birth year]) female.  Chief Complaint: No chief complaint on file.   HPI Ann Kim presents to the office today for follow-up of depression and anxiety.  Describes mood today as "not so good". Pleasant. Mood symptoms - denies depression, anxiety, and irritability. Stating "I feel good right now". Feels better in the summer months. Mood typically declines in the winter months - along with increased stress from church responsibilities. Getting out every day. Spending time with daughters. Recent trip to [place]. Stable interest and motivation. Taking medications as prescribed and feels it works well for her.  Energy levels stable. Stating "I don't have the stamina I used to have". Taking breaks when I need them. Active, but does not have a regular exercise routine. Retired.  Enjoys some usual interests and activities. Lives alone. Involved in church. Talking with family and friends.  Appetite adequate. Weight stable.   Sleeps well most night. Averages 6 to 8 hours.  Focus and concentration stable. Completing tasks. Manages aspects of household. Retired Runner, broadcasting/film/video. Denies SI or HI.  Denies AH or VH.   Review of Systems:  Review of Systems  Musculoskeletal:  Negative for gait problem.  Neurological:  Negative for tremors.  Psychiatric/Behavioral:         Please refer to HPI   Medications: I have reviewed the patient's current medications.  Current Outpatient Medications  Medication Sig Dispense Refill   buPROPion (WELLBUTRIN SR) 100 MG 12 hr tablet Take 1 tablet (100 mg total) by mouth in the morning. 30 tablet 5   Cholecalciferol (VITAMIN D-1000 MAX ST) 25 MCG (1000 UT) tablet Take by mouth.     COVID-19 mRNA vaccine, Moderna, 100 MCG/0.5ML injection AS DIRECTED .25 mL 0   doxycycline (PERIOSTAT) 20 MG tablet TK 1 T PO BID     FLUoxetine (PROZAC) 20 MG capsule Take one capsule  twice daily. 60 capsule 5   influenza vaccine adjuvanted (FLUAD) 0.5 ML injection INJECT AS DIRECTED .5 mL 0   ketoconazole (NIZORAL) 2 % cream Take as directed     levothyroxine (SYNTHROID) 88 MCG tablet TK 1 T PO D     LORazepam (ATIVAN) 0.5 MG tablet Take one tablet daily as needed for anxiety. 30 tablet 2   METRONIDAZOLE, TOPICAL, 0.75 % LOTN APPLY AND RUB IN A THIN FILM TO AFFECTED AREA BID.  QAM AND QPM     naproxen (NAPROSYN) 500 MG tablet Take by mouth.     No current facility-administered medications for this visit.    Medication Side Effects: None  Allergies:  Allergies  Allergen Reactions   Codeine Nausea Only   Molds & Smuts Other (See Comments)   Pollen Extract Other (See Comments)   Penicillins Itching   Sulfa Antibiotics Itching    Past Medical History:  Diagnosis Date   Anxiety    Depression     Past Medical History, Surgical history, Social history, and Family history were reviewed and updated as appropriate.   Please see review of systems for further details on the patient's review from today.   Objective:   Physical Exam:  There were no vitals taken for this visit.  Physical Exam Constitutional:      General: She is not in acute distress. Musculoskeletal:        General: No deformity.  Neurological:     Mental Status: She is alert and  oriented to person, place, and time.     Coordination: Coordination normal.  Psychiatric:        Attention and Perception: Attention and perception normal. She does not perceive auditory or visual hallucinations.        Mood and Affect: Mood normal. Mood is not anxious or depressed. Affect is not labile, blunt, angry or inappropriate.        Speech: Speech normal.        Behavior: Behavior normal.        Thought Content: Thought content normal. Thought content is not paranoid or delusional. Thought content does not include homicidal or suicidal ideation. Thought content does not include homicidal or suicidal plan.         Cognition and Memory: Cognition and memory normal.        Judgment: Judgment normal.     Comments: Insight intact    Lab Review:     Component Value Date/Time   NA 138 02/21/2020 1834   K 4.7 02/21/2020 1834   CL 102 02/21/2020 1834   CO2 24 02/21/2020 1834   GLUCOSE 107 (H) 02/21/2020 1834   BUN 19 02/21/2020 1834   CREATININE 0.83 02/21/2020 1834   CALCIUM 9.4 02/21/2020 1834   PROT 6.8 02/21/2020 1834   ALBUMIN 3.8 02/21/2020 1834   AST 30 02/21/2020 1834   ALT 25 02/21/2020 1834   ALKPHOS 68 02/21/2020 1834   BILITOT 0.2 (L) 02/21/2020 1834   GFRNONAA >60 02/21/2020 1834       Component Value Date/Time   WBC 7.9 02/21/2020 1834   RBC 4.46 02/21/2020 1834   HGB 14.3 02/21/2020 1834   HCT 42.9 02/21/2020 1834   PLT 264 02/21/2020 1834   MCV 96.2 02/21/2020 1834   MCH 32.1 02/21/2020 1834   MCHC 33.3 02/21/2020 1834   RDW 13.4 02/21/2020 1834   LYMPHSABS 1.7 02/21/2020 1834   MONOABS 0.6 02/21/2020 1834   EOSABS 0.2 02/21/2020 1834   BASOSABS 0.0 02/21/2020 1834    No results found for: POCLITH, LITHIUM   No results found for: PHENYTOIN, PHENOBARB, VALPROATE, CBMZ   .res Assessment: Plan:    Plan:  1. Continue Prozac 20mg  in the morning / 10mg  at bedtime  2. Continue Ativan 0.5mg  daily prn anxiety  Discussed potential benefits, risk, and side effects of benzodiazepines to include potential risk of tolerance and dependence, as well as possible drowsiness.  Advised patient not to drive if experiencing drowsiness and to take lowest possible effective dose to minimize risk of dependence and tolerance.  Patient advised to contact office with any questions, adverse effects, or acute worsening in signs and symptoms.  RTC 6 months   Diagnoses and all orders for this visit:  Depression, unspecified depression type  GAD (generalized anxiety disorder)    Please see After Visit Summary for patient specific instructions.  No future appointments.  No  orders of the defined types were placed in this encounter.   -------------------------------

## 2020-11-14 ENCOUNTER — Telehealth: Payer: Self-pay | Admitting: Adult Health

## 2020-11-14 NOTE — Telephone Encounter (Signed)
Pt stated you are aware that in the winter time her depression is worse.She wants to know if she can go ahead and increase prozac to 20 mg in am and 20 mg at bedtime.

## 2020-11-14 NOTE — Telephone Encounter (Signed)
LVM to return call.

## 2020-11-14 NOTE — Telephone Encounter (Signed)
Pt called to report she may need a med adjustment. Depression worsens as the sun light gets shorter. Would like to get on top of this know issue before it hits. Contact # [phone]. APT 12/14

## 2020-11-15 NOTE — Telephone Encounter (Signed)
Yes

## 2020-11-15 NOTE — Telephone Encounter (Signed)
Contact Pt and note file

## 2020-11-17 ENCOUNTER — Other Ambulatory Visit: Payer: Self-pay

## 2020-11-17 DIAGNOSIS — F32A Depression, unspecified: Secondary | ICD-10-CM

## 2020-11-17 DIAGNOSIS — F411 Generalized anxiety disorder: Secondary | ICD-10-CM

## 2020-11-17 MED ORDER — FLUOXETINE HCL 20 MG PO CAPS: ORAL_CAPSULE | ORAL | 3 refills | Status: DC

## 2020-11-17 NOTE — Progress Notes (Signed)
Dose increase on Prozac 20 mg in the am and 20 mg in the pm.

## 2020-11-17 NOTE — Telephone Encounter (Signed)
Rtc to pt and informed her it is okay to increase to Prozac 20 mg in the morning and 20 mg at hs. Will update her med list. She reports in Nov/Dec she gets in a "pit" and wants to try and catch it before she does.

## 2021-01-09 ENCOUNTER — Other Ambulatory Visit: Payer: Self-pay

## 2021-01-09 DIAGNOSIS — F411 Generalized anxiety disorder: Secondary | ICD-10-CM

## 2021-01-09 DIAGNOSIS — F32A Depression, unspecified: Secondary | ICD-10-CM

## 2021-01-09 MED ORDER — FLUOXETINE HCL 20 MG PO CAPS: ORAL_CAPSULE | ORAL | 3 refills | Status: DC

## 2021-02-14 ENCOUNTER — Ambulatory Visit (INDEPENDENT_AMBULATORY_CARE_PROVIDER_SITE_OTHER): Payer: Medicare Other | Admitting: Adult Health

## 2021-02-14 ENCOUNTER — Other Ambulatory Visit: Payer: Self-pay

## 2021-02-14 ENCOUNTER — Encounter: Payer: Self-pay | Admitting: Adult Health

## 2021-02-14 DIAGNOSIS — F411 Generalized anxiety disorder: Secondary | ICD-10-CM | POA: Diagnosis not present

## 2021-02-14 DIAGNOSIS — F32A Depression, unspecified: Secondary | ICD-10-CM

## 2021-02-14 NOTE — Progress Notes (Signed)
Ann Kim [id number] 08-03-1931 87 y.o.  Subjective:   Patient ID:  Ann Kim is a 87 y.o. (DOB 1931-06-20) female.  Chief Complaint: No chief complaint on file.   HPI Ann Kim presents to the office today for follow-up of depression and anxiety.  Describes mood today as "not so good". Pleasant. Mood symptoms - denies depression, anxiety, and irritability. Stating "I feel fine". Feels like the Prozac 20mg  twice daily works well for her. Involved in church. Stable interest and motivation. Taking medications as prescribed and feels it works well for her.  Energy levels stable. Active, but does not have a regular exercise routine. Retired.  Enjoys some usual interests and activities. Lives alone. Involved in church. Has 2 daughters - grandchildren. Talking with family and friends.  Appetite adequate. Weight stable.   Sleeps well most night. Averages 6 to 8 hours.  Focus and concentration stable. Completing tasks. Manages aspects of household. Retired . Denies SI or HI.  Denies AH or VH.    Review of Systems:  Review of Systems  Musculoskeletal:  Negative for gait problem.  Neurological:  Negative for tremors.  Psychiatric/Behavioral:         Please refer to HPI   Medications: I have reviewed the patient's current medications.  Current Outpatient Medications  Medication Sig Dispense Refill   Cholecalciferol (VITAMIN D-1000 MAX ST) 25 MCG (1000 UT) tablet Take by mouth.     doxycycline (PERIOSTAT) 20 MG tablet TK 1 T PO BID     FLUoxetine (PROZAC) 20 MG capsule Take one capsule (20 mg) daily in the morning and one capsule (20 mg) daily in the pm. 180 capsule 3   ketoconazole (NIZORAL) 2 % cream Take as directed     levothyroxine (SYNTHROID) 88 MCG tablet TK 1 T PO D     LORazepam (ATIVAN) 0.5 MG tablet Take one tablet daily as needed for anxiety. 30 tablet 2   METRONIDAZOLE, TOPICAL, 0.75 % LOTN APPLY AND RUB IN A THIN FILM TO AFFECTED AREA BID.  QAM AND QPM      naproxen (NAPROSYN) 500 MG tablet Take by mouth.     No current facility-administered medications for this visit.    Medication Side Effects: None  Allergies:  Allergies  Allergen Reactions   Codeine Nausea Only   Molds & Smuts Other (See Comments)   Pollen Extract Other (See Comments)   Penicillins Itching   Sulfa Antibiotics Itching    Past Medical History:  Diagnosis Date   Anxiety    Depression     Past Medical History, Surgical history, Social history, and Family history were reviewed and updated as appropriate.   Please see review of systems for further details on the patient's review from today.   Objective:   Physical Exam:  There were no vitals taken for this visit.  Physical Exam Constitutional:      General: She is not in acute distress. Musculoskeletal:        General: No deformity.  Neurological:     Mental Status: She is alert and oriented to person, place, and time.     Coordination: Coordination normal.  Psychiatric:        Attention and Perception: Attention and perception normal. She does not perceive auditory or visual hallucinations.        Mood and Affect: Mood normal. Mood is not anxious or depressed. Affect is not labile, blunt, angry or inappropriate.        Speech: Speech  normal.        Behavior: Behavior normal.        Thought Content: Thought content normal. Thought content is not paranoid or delusional. Thought content does not include homicidal or suicidal ideation. Thought content does not include homicidal or suicidal plan.        Cognition and Memory: Cognition and memory normal.        Judgment: Judgment normal.     Comments: Insight intact    Lab Review:     Component Value Date/Time   NA 138 02/21/2020 1834   K 4.7 02/21/2020 1834   CL 102 02/21/2020 1834   CO2 24 02/21/2020 1834   GLUCOSE 107 (H) 02/21/2020 1834   BUN 19 02/21/2020 1834   CREATININE 0.83 02/21/2020 1834   CALCIUM 9.4 02/21/2020 1834   PROT 6.8  02/21/2020 1834   ALBUMIN 3.8 02/21/2020 1834   AST 30 02/21/2020 1834   ALT 25 02/21/2020 1834   ALKPHOS 68 02/21/2020 1834   BILITOT 0.2 (L) 02/21/2020 1834   GFRNONAA >60 02/21/2020 1834       Component Value Date/Time   WBC 7.9 02/21/2020 1834   RBC 4.46 02/21/2020 1834   HGB 14.3 02/21/2020 1834   HCT 42.9 02/21/2020 1834   PLT 264 02/21/2020 1834   MCV 96.2 02/21/2020 1834   MCH 32.1 02/21/2020 1834   MCHC 33.3 02/21/2020 1834   RDW 13.4 02/21/2020 1834   LYMPHSABS 1.7 02/21/2020 1834   MONOABS 0.6 02/21/2020 1834   EOSABS 0.2 02/21/2020 1834   BASOSABS 0.0 02/21/2020 1834    No results found for: POCLITH, LITHIUM   No results found for: PHENYTOIN, PHENOBARB, VALPROATE, CBMZ   .res Assessment: Plan:    Plan:  Prozac 20mg  BID  Ativan 0.5mg  daily prn anxiety - take twice since last visit  Discussed potential benefits, risk, and side effects of benzodiazepines to include potential risk of tolerance and dependence, as well as possible drowsiness.  Advised patient not to drive if experiencing drowsiness and to take lowest possible effective dose to minimize risk of dependence and tolerance.  Patient advised to contact office with any questions, adverse effects, or acute worsening in signs and symptoms.  RTC 1 year  There are no diagnoses linked to this encounter.   Please see After Visit Summary for patient specific instructions.  No future appointments.  No orders of the defined types were placed in this encounter.   -------------------------------

## 2021-07-11 ENCOUNTER — Telehealth: Payer: Self-pay | Admitting: Adult Health

## 2021-07-11 NOTE — Telephone Encounter (Signed)
Pt LVM @ 3:56p.  She said she is wanting to know if Almira Coaster can add something to her anti-depressant. ? ?Next appt 12/14 ?

## 2021-07-12 NOTE — Telephone Encounter (Signed)
LVM to RC 

## 2021-07-13 NOTE — Telephone Encounter (Signed)
Called patient again. She wants to talk to Barnett Applebaum, would not provide any information. I told her you were not in the office today.  ?

## 2021-07-13 NOTE — Telephone Encounter (Signed)
Ok ty. Tell her to call again on Monday.

## 2021-07-20 NOTE — Telephone Encounter (Signed)
Yes I will

## 2021-07-20 NOTE — Telephone Encounter (Signed)
Patient has not called back and would not provide any information to me.

## 2021-07-20 NOTE — Telephone Encounter (Signed)
Ok - will you message me to call her on Monday pls?

## 2021-07-23 ENCOUNTER — Telehealth: Payer: Self-pay | Admitting: Adult Health

## 2021-07-24 NOTE — Telephone Encounter (Signed)
Called and spoke with patient.

## 2021-08-15 ENCOUNTER — Encounter: Payer: Self-pay | Admitting: Adult Health

## 2021-08-15 ENCOUNTER — Ambulatory Visit (INDEPENDENT_AMBULATORY_CARE_PROVIDER_SITE_OTHER): Payer: Medicare Other | Admitting: Adult Health

## 2021-08-15 DIAGNOSIS — F32A Depression, unspecified: Secondary | ICD-10-CM | POA: Diagnosis not present

## 2021-08-15 DIAGNOSIS — F411 Generalized anxiety disorder: Secondary | ICD-10-CM | POA: Diagnosis not present

## 2021-08-15 NOTE — Progress Notes (Signed)
[name] [id number] 01-28-1932 88 y.o.  Subjective:   Patient ID:  Ann Kim is a 88 y.o. (DOB 02/28/1932) female.  Chief Complaint: No chief complaint on file.   HPI Manjit Stidd Affinito presents to the office today for follow-up of depression and anxiety.  Describes mood today as "not so good". Pleasant. Mood symptoms - reports increased depression over the past 6 months - reporting an 8 out of 10. Denies anxiety and irritability. Stating "I've been more depressed". Feels like a lack of social interaction is contributing to her declining mood. Going to daughter's houses during the week and attends church on the weekends.Taking the Prozac 20mg  twice daily and does not feel it is working as well. Has seen commercials for Vraylar and is considering other options to help with mood symptoms. Stable interest and motivation. Taking medications as prescribed.  Energy levels stable. Active, but does not have a regular exercise routine. Retired.  Enjoys some usual interests and activities. Lives alone. Involved in church. Has 2 daughters - grandchildren. Talking with family and friends.  Appetite adequate. Weight stable.   Sleeps well most night. Averages 6 to 8 hours.  Focus and concentration stable. Completing tasks. Manages aspects of household. Retired Pharmacist, hospital. Denies SI or HI.  Denies AH or VH.  Review of Systems:  Review of Systems  Musculoskeletal:  Negative for gait problem.  Neurological:  Negative for tremors.  Psychiatric/Behavioral:         Please refer to HPI    Medications: I have reviewed the patient's current medications.  Current Outpatient Medications  Medication Sig Dispense Refill   Cholecalciferol (VITAMIN D-1000 MAX ST) 25 MCG (1000 UT) tablet Take by mouth.     doxycycline (PERIOSTAT) 20 MG tablet TK 1 T PO BID     FLUoxetine (PROZAC) 20 MG capsule Take one capsule (20 mg) daily in the morning and one capsule (20 mg) daily in the pm. 180 capsule 3   ketoconazole  (NIZORAL) 2 % cream Take as directed     levothyroxine (SYNTHROID) 88 MCG tablet TK 1 T PO D     LORazepam (ATIVAN) 0.5 MG tablet Take one tablet daily as needed for anxiety. 30 tablet 2   METRONIDAZOLE, TOPICAL, 0.75 % LOTN APPLY AND RUB IN A THIN FILM TO AFFECTED AREA BID.  QAM AND QPM     naproxen (NAPROSYN) 500 MG tablet Take by mouth.     No current facility-administered medications for this visit.    Medication Side Effects: None  Allergies:  Allergies  Allergen Reactions   Codeine Nausea Only   Molds & Smuts Other (See Comments)   Pollen Extract Other (See Comments)   Penicillins Itching   Sulfa Antibiotics Itching    Past Medical History:  Diagnosis Date   Anxiety    Depression     Past Medical History, Surgical history, Social history, and Family history were reviewed and updated as appropriate.   Please see review of systems for further details on the patient's review from today.   Objective:   Physical Exam:  There were no vitals taken for this visit.  Physical Exam Constitutional:      General: She is not in acute distress. Musculoskeletal:        General: No deformity.  Neurological:     Mental Status: She is alert and oriented to person, place, and time.     Coordination: Coordination normal.  Psychiatric:        Attention and  Perception: Attention and perception normal. She does not perceive auditory or visual hallucinations.        Mood and Affect: Mood normal. Mood is not anxious or depressed. Affect is not labile, blunt, angry or inappropriate.        Speech: Speech normal.        Behavior: Behavior normal.        Thought Content: Thought content normal. Thought content is not paranoid or delusional. Thought content does not include homicidal or suicidal ideation. Thought content does not include homicidal or suicidal plan.        Cognition and Memory: Cognition and memory normal.        Judgment: Judgment normal.     Comments: Insight intact      Lab Review:     Component Value Date/Time   NA 138 02/21/2020 1834   K 4.7 02/21/2020 1834   CL 102 02/21/2020 1834   CO2 24 02/21/2020 1834   GLUCOSE 107 (H) 02/21/2020 1834   BUN 19 02/21/2020 1834   CREATININE 0.83 02/21/2020 1834   CALCIUM 9.4 02/21/2020 1834   PROT 6.8 02/21/2020 1834   ALBUMIN 3.8 02/21/2020 1834   AST 30 02/21/2020 1834   ALT 25 02/21/2020 1834   ALKPHOS 68 02/21/2020 1834   BILITOT 0.2 (L) 02/21/2020 1834   GFRNONAA >60 02/21/2020 1834       Component Value Date/Time   WBC 7.9 02/21/2020 1834   RBC 4.46 02/21/2020 1834   HGB 14.3 02/21/2020 1834   HCT 42.9 02/21/2020 1834   PLT 264 02/21/2020 1834   MCV 96.2 02/21/2020 1834   MCH 32.1 02/21/2020 1834   MCHC 33.3 02/21/2020 1834   RDW 13.4 02/21/2020 1834   LYMPHSABS 1.7 02/21/2020 1834   MONOABS 0.6 02/21/2020 1834   EOSABS 0.2 02/21/2020 1834   BASOSABS 0.0 02/21/2020 1834    No results found for: "POCLITH", "LITHIUM"   No results found for: "PHENYTOIN", "PHENOBARB", "VALPROATE", "CBMZ"   .res Assessment: Plan:    Plan:  Prozac 20mg  BID. Ativan 0.5mg  daily prn anxiety - has not used in 6 months.  Add Rexulti 0.5mg  - 1/2 tablet daily - samples given.  Will call back in a week with update.  Discussed potential metabolic side effects associated with atypical antipsychotics, as well as potential risk for movement side effects. Advised pt to contact office if movement side effects occur.    Discussed potential benefits, risk, and side effects of benzodiazepines to include potential risk of tolerance and dependence, as well as possible drowsiness.  Advised patient not to drive if experiencing drowsiness and to take lowest possible effective dose to minimize risk of dependence and tolerance.  Patient advised to contact office with any questions, adverse effects, or acute worsening in signs and symptoms.  RTC 6 months   Time spent with patient was 20 minutes. Greater than 50% of  face to face time with patient was spent on counseling and coordination of care.    There are no diagnoses linked to this encounter.   Please see After Visit Summary for patient specific instructions.  Future Appointments  Date Time Provider [place]  02/14/2022  1:00 PM Evva Din, Berdie Ogren, NP CP-CP None    No orders of the defined types were placed in this encounter.   -------------------------------

## 2021-08-24 ENCOUNTER — Telehealth: Payer: Self-pay | Admitting: Adult Health

## 2021-09-24 ENCOUNTER — Telehealth: Payer: Self-pay | Admitting: Adult Health

## 2021-09-24 ENCOUNTER — Other Ambulatory Visit: Payer: Self-pay | Admitting: Adult Health

## 2021-09-24 DIAGNOSIS — F411 Generalized anxiety disorder: Secondary | ICD-10-CM

## 2021-09-24 DIAGNOSIS — F32A Depression, unspecified: Secondary | ICD-10-CM

## 2021-09-24 MED ORDER — REXULTI 0.5 MG PO TABS: 0.5000 mg | ORAL_TABLET | Freq: Every day | ORAL | 2 refills | Status: DC

## 2021-09-24 NOTE — Telephone Encounter (Signed)
Ann Kim called this morning at 10:45 to request prescription for the new medication you have her on.  She has been using samples but it is time for a prescription.  She does want to discuss the prescription before it is sent in.  Please call [phone] (h) or [phone](c).  Appt 12/14

## 2021-09-24 NOTE — Telephone Encounter (Signed)
Called and spoke with patient.

## 2021-09-24 NOTE — Telephone Encounter (Signed)
Called patient and she said she is taking 1/2 of a 0.5 mg tablet of Rexulti. She said it is working well for her and she needs an Rx sent in but doesn't know if you want to increase the dose. She is frustrated - says that "at this location" she is unable to talk directly to you and that is what she wants to do.

## 2021-09-28 ENCOUNTER — Telehealth: Payer: Self-pay

## 2021-10-01 ENCOUNTER — Telehealth: Payer: Self-pay | Admitting: Adult Health

## 2021-10-01 NOTE — Telephone Encounter (Signed)
I believe this may need a PA

## 2021-10-01 NOTE — Telephone Encounter (Signed)
Pt said Ann Kim has her taking Rexualti to help with the antidepressant she was taking.  Even at the 0.25mg , she said the medicine is working well.  However, when she asked Ann Kim to call the script in to the pharmacy, the said they needed to know why she needed it because it was so expensive.  She said it started out at $1200 for 60 tablets (2 pills daily for 30 days), then it went down to $940 (she didn't say what caused it to go down).  She is staying it's still too high and she needs to know what to do.  Next appt 12/14

## 2021-10-02 NOTE — Telephone Encounter (Signed)
Can we follow up with her reagarding patient assistance? Also let let know we can get samples if needed.

## 2021-10-02 NOTE — Telephone Encounter (Signed)
Rexulti was already approved on 09/28/2021-09/28/2024 with [facility] unless she has changed insurance since then. She is medicare and is unable to use a co-pay card so if it is too expensive we would have to check into pt. Assistance.

## 2021-10-03 NOTE — Telephone Encounter (Signed)
I  have pulled 0.5mg  samples for [place] and have printed a Pt. Assistance application and put with the samples.  I have talked with Dewayne Hatch and told we have the samples and talked with her about the Pt. Assistance.  Once she returns the application to [place] we will fax it in for processing.

## 2021-10-03 NOTE — Telephone Encounter (Signed)
Noted thank you

## 2021-10-09 NOTE — Telephone Encounter (Signed)
Prior Authorization submitted and approved for REXULTI effective 09/28/2021-09/28/2024 with [facility]

## 2021-10-15 ENCOUNTER — Telehealth: Payer: Self-pay

## 2021-10-15 DIAGNOSIS — F32A Depression, unspecified: Secondary | ICD-10-CM

## 2021-10-15 DIAGNOSIS — F411 Generalized anxiety disorder: Secondary | ICD-10-CM

## 2021-10-15 MED ORDER — FLUOXETINE HCL 20 MG PO CAPS: ORAL_CAPSULE | ORAL | 0 refills | Status: DC

## 2022-01-08 ENCOUNTER — Other Ambulatory Visit: Payer: Self-pay

## 2022-01-08 ENCOUNTER — Telehealth: Payer: Self-pay | Admitting: Adult Health

## 2022-01-08 DIAGNOSIS — F32A Depression, unspecified: Secondary | ICD-10-CM

## 2022-01-08 MED ORDER — REXULTI 0.5 MG PO TABS: 0.5000 mg | ORAL_TABLET | Freq: Every day | ORAL | 2 refills | Status: DC

## 2022-01-08 NOTE — Telephone Encounter (Signed)
Rx sent 

## 2022-01-08 NOTE — Telephone Encounter (Signed)
Next visit is 02/14/22. Lyne called requesting a refill on her Rexulti called to:   [facility], [address]   Phone: [phone]  Fax: [phone]

## 2022-01-15 ENCOUNTER — Other Ambulatory Visit: Payer: Self-pay | Admitting: Adult Health

## 2022-01-15 DIAGNOSIS — F411 Generalized anxiety disorder: Secondary | ICD-10-CM

## 2022-01-15 DIAGNOSIS — F32A Depression, unspecified: Secondary | ICD-10-CM

## 2022-02-14 ENCOUNTER — Ambulatory Visit: Payer: Medicare Other | Admitting: Adult Health

## 2022-03-08 ENCOUNTER — Ambulatory Visit: Payer: Medicare Other | Admitting: Adult Health

## 2022-03-08 NOTE — Progress Notes (Addendum)
Patient late due to accident.

## 2022-03-08 NOTE — Addendum Note (Signed)
Addended by: Aloha Gell on: 03/08/2022 02:06 PM   Modules accepted: Level of Service

## 2022-04-02 ENCOUNTER — Telehealth: Payer: Self-pay | Admitting: Adult Health

## 2022-04-02 NOTE — Telephone Encounter (Signed)
I called Ann Kim today at 10:20 to talk with her about her [place].  I'm not sure if she still has a refill but I told her would send in a new prescription.  [place] does not qualify for pt. 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  She can use the coupon if the pharmacy will run it under [place] instead of [place].  Ilyse remembered this after I talked with her.  When she picks up the medication she will remind the pharmacy to run under [place].  If can stipulate that as well perhaps they will run it as needed so she can use the coupon and get the lower cost of the medication.

## 2022-04-02 NOTE — Telephone Encounter (Signed)
FYI

## 2022-04-02 NOTE — Telephone Encounter (Signed)
Noted. Ty!

## 2022-04-23 ENCOUNTER — Other Ambulatory Visit: Payer: Self-pay | Admitting: Adult Health

## 2022-04-23 DIAGNOSIS — F411 Generalized anxiety disorder: Secondary | ICD-10-CM

## 2022-04-23 DIAGNOSIS — F32A Depression, unspecified: Secondary | ICD-10-CM

## 2022-04-24 NOTE — Telephone Encounter (Signed)
Please call to schedule F/U. Was a no show for January appt.

## 2022-04-26 NOTE — Telephone Encounter (Signed)
Apt 3/12

## 2022-05-14 ENCOUNTER — Encounter: Payer: Self-pay | Admitting: Adult Health

## 2022-05-14 ENCOUNTER — Ambulatory Visit: Payer: Medicare Other | Admitting: Adult Health

## 2022-05-14 DIAGNOSIS — F411 Generalized anxiety disorder: Secondary | ICD-10-CM | POA: Diagnosis not present

## 2022-05-14 DIAGNOSIS — F32A Depression, unspecified: Secondary | ICD-10-CM

## 2022-05-14 MED ORDER — FLUOXETINE HCL 20 MG PO CAPS: ORAL_CAPSULE | ORAL | 5 refills | Status: DC

## 2022-05-14 MED ORDER — REXULTI 0.5 MG PO TABS: 0.5000 mg | ORAL_TABLET | Freq: Every day | ORAL | 5 refills | Status: DC

## 2022-05-14 NOTE — Progress Notes (Signed)
Marivi Durrence Golightly [id number] 01/19/1932 89 y.o.  Subjective:   Patient ID:  Ann Kim is a 88 y.o. (DOB [birth year]) female.  Chief Complaint: No chief complaint on file.   HPI Amir Witz Cliett presents to the office today for follow-up of depression and anxiety.  Describes mood today as "ok". Pleasant. Tearful at times. Mood symptoms - reports some depression - staying at home more. Has relinquished church responsibilities. Feels anxious at times. Denies irritability. Denies worry, rumination, and over thinking. Mood is lower. Lacking interaction - trying to figure ways to interact with others. Visits with daughters every weekend. Plans to attend church on the weekends as weather permits. Stating "I'm spending too much time alone - lacking stimulation". Feels like medications are helpful - would like to consider an increase in [place]. Stable interest and motivation. Taking medications as prescribed.  Energy levels stable. Active, but does not have a regular exercise routine. Retired.  Enjoys some usual interests and activities. Lives alone. Attends church. Has 2 daughters - grandchildren. Talking with family and friends.  Appetite adequate. Weight stable.   Sleeps well most night. Averages 6 to 8 hours.  Focus and concentration stable. Completing tasks. Manages aspects of household. Retired Pharmacist, hospital.  Denies SI or HI.  Denies AH or VH. Denies substance use. Denies self harm.   Review of Systems:  Review of Systems  Musculoskeletal:  Negative for gait problem.  Neurological:  Negative for tremors.  Psychiatric/Behavioral:         Please refer to HPI    Medications: I have reviewed the patient's current medications.  Current Outpatient Medications  Medication Sig Dispense Refill   Brexpiprazole (REXULTI) 0.5 MG TABS Take 1 tablet (0.5 mg total) by mouth daily. 30 tablet 2   Cholecalciferol (VITAMIN D-1000 MAX ST) 25 MCG (1000 UT) tablet Take by mouth.     doxycycline (PERIOSTAT) 20 MG  tablet TK 1 T PO BID     FLUoxetine (PROZAC) 20 MG capsule TAKE 1 CAPSULE BY MOUTH EVERY MORNING AND 1 CAPSULE BY MOUTH EVERY EVENING 60 capsule 0   ketoconazole (NIZORAL) 2 % cream Take as directed     levothyroxine (SYNTHROID) 88 MCG tablet TK 1 T PO D     LORazepam (ATIVAN) 0.5 MG tablet Take one tablet daily as needed for anxiety. 30 tablet 2   METRONIDAZOLE, TOPICAL, 0.75 % LOTN APPLY AND RUB IN A THIN FILM TO AFFECTED AREA BID.  QAM AND QPM     naproxen (NAPROSYN) 500 MG tablet Take by mouth.     No current facility-administered medications for this visit.    Medication Side Effects: None  Allergies:  Allergies  Allergen Reactions   Codeine Nausea Only   Molds & Smuts Other (See Comments)   Pollen Extract Other (See Comments)   Penicillins Itching   Sulfa Antibiotics Itching    Past Medical History:  Diagnosis Date   Anxiety    Depression     Past Medical History, Surgical history, Social history, and Family history were reviewed and updated as appropriate.   Please see review of systems for further details on the patient's review from today.   Objective:   Physical Exam:  There were no vitals taken for this visit.  Physical Exam Constitutional:      General: She is not in acute distress. Musculoskeletal:        General: No deformity.  Neurological:     Mental Status: She is alert and oriented to person,  place, and time.     Coordination: Coordination normal.  Psychiatric:        Attention and Perception: Attention and perception normal. She does not perceive auditory or visual hallucinations.        Mood and Affect: Mood normal. Mood is not anxious or depressed. Affect is not labile, blunt, angry or inappropriate.        Speech: Speech normal.        Behavior: Behavior normal.        Thought Content: Thought content normal. Thought content is not paranoid or delusional. Thought content does not include homicidal or suicidal ideation. Thought content does not  include homicidal or suicidal plan.        Cognition and Memory: Cognition and memory normal.        Judgment: Judgment normal.     Comments: Insight intact     Lab Review:     Component Value Date/Time   NA 138 02/21/2020 1834   K 4.7 02/21/2020 1834   CL 102 02/21/2020 1834   CO2 24 02/21/2020 1834   GLUCOSE 107 (H) 02/21/2020 1834   BUN 19 02/21/2020 1834   CREATININE 0.83 02/21/2020 1834   CALCIUM 9.4 02/21/2020 1834   PROT 6.8 02/21/2020 1834   ALBUMIN 3.8 02/21/2020 1834   AST 30 02/21/2020 1834   ALT 25 02/21/2020 1834   ALKPHOS 68 02/21/2020 1834   BILITOT 0.2 (L) 02/21/2020 1834   GFRNONAA >60 02/21/2020 1834       Component Value Date/Time   WBC 7.9 02/21/2020 1834   RBC 4.46 02/21/2020 1834   HGB 14.3 02/21/2020 1834   HCT 42.9 02/21/2020 1834   PLT 264 02/21/2020 1834   MCV 96.2 02/21/2020 1834   MCH 32.1 02/21/2020 1834   MCHC 33.3 02/21/2020 1834   RDW 13.4 02/21/2020 1834   LYMPHSABS 1.7 02/21/2020 1834   MONOABS 0.6 02/21/2020 1834   EOSABS 0.2 02/21/2020 1834   BASOSABS 0.0 02/21/2020 1834    No results found for: "POCLITH", "LITHIUM"   No results found for: "PHENYTOIN", "PHENOBARB", "VALPROATE", "CBMZ"   .res Assessment: Plan:    Plan:  Increase Rexulti 0.'5mg'$  - 1/2 tablet daily to one daily. Prozac '20mg'$  BID.  Ativan 0.'5mg'$  daily prn anxiety - has used 2 to 3 times since last visit  Discussed potential metabolic side effects associated with atypical antipsychotics, as well as potential risk for movement side effects. Advised pt to contact office if movement side effects occur.    Discussed potential benefits, risk, and side effects of benzodiazepines to include potential risk of tolerance and dependence, as well as possible drowsiness.  Advised patient not to drive if experiencing drowsiness and to take lowest possible effective dose to minimize risk of dependence and tolerance.  Patient advised to contact office with any questions,  adverse effects, or acute worsening in signs and symptoms.  RTC 6 months  Time spent with patient was 20 minutes. Greater than 50% of face to face time with patient was spent on counseling and coordination of care.    There are no diagnoses linked to this encounter.   Please see After Visit Summary for patient specific instructions.  No future appointments.  No orders of the defined types were placed in this encounter.   -------------------------------

## 2022-06-18 ENCOUNTER — Other Ambulatory Visit: Payer: Self-pay | Admitting: Adult Health

## 2022-06-18 DIAGNOSIS — F32A Depression, unspecified: Secondary | ICD-10-CM

## 2022-06-18 DIAGNOSIS — F411 Generalized anxiety disorder: Secondary | ICD-10-CM

## 2022-07-03 IMAGING — CT CT HEAD W/O CM
4 series · 16 of 47 positions shown, 18 images · non-contrast
Comparison: None.

CLINICAL DATA: Dizziness. Nonspecific. Fall from standing 2 hours
ago.

EXAM:
CT HEAD WITHOUT CONTRAST
TECHNIQUE: Contiguous axial images were obtained from the base of the skull
through the vertex without intravenous contrast.

[Series 2: head wo · axial · 0.42mm/px · z∈[+38,+153]mm · 7 of 31 slices shown, 9 images]
[im 4/31  brain]
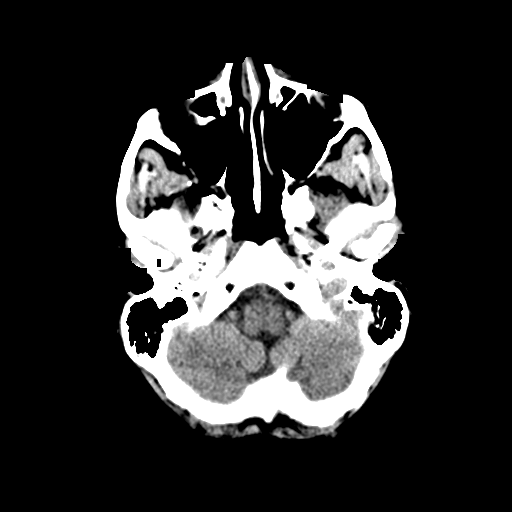
[im 4/31  bone]
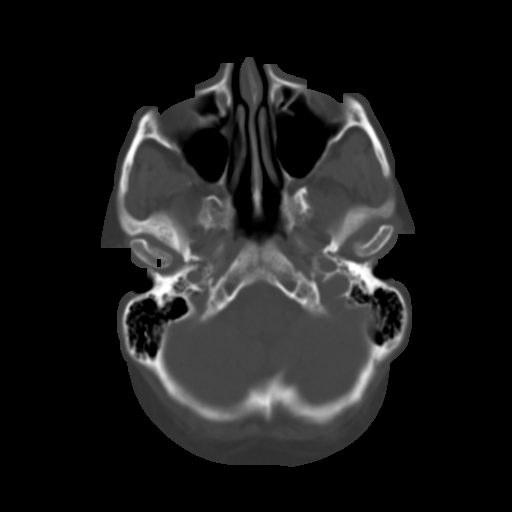
[im 8/31  brain]
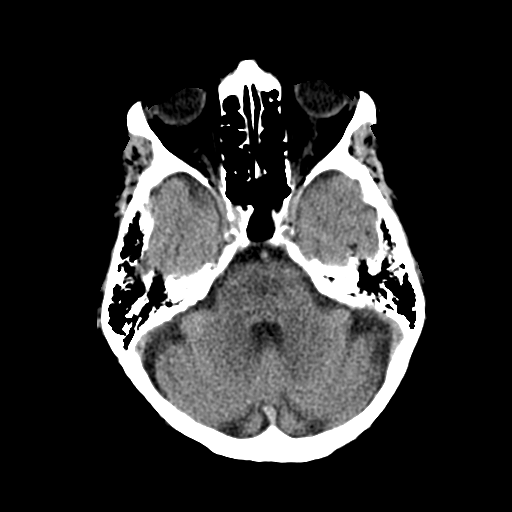
[im 12/31  brain]
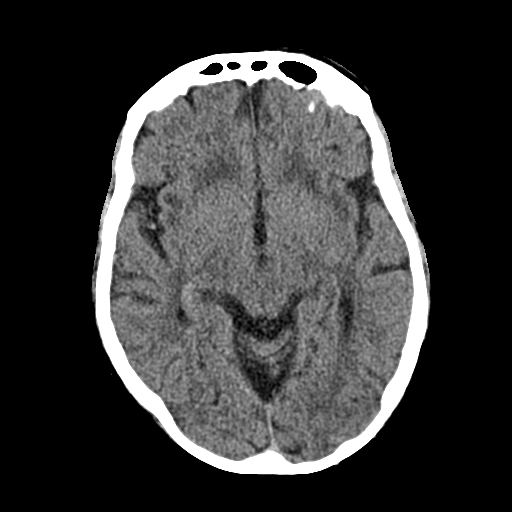
[im 16/31  brain]
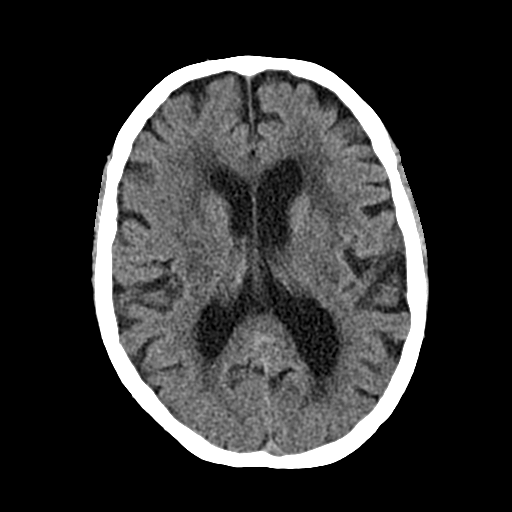
[im 19/31  brain]
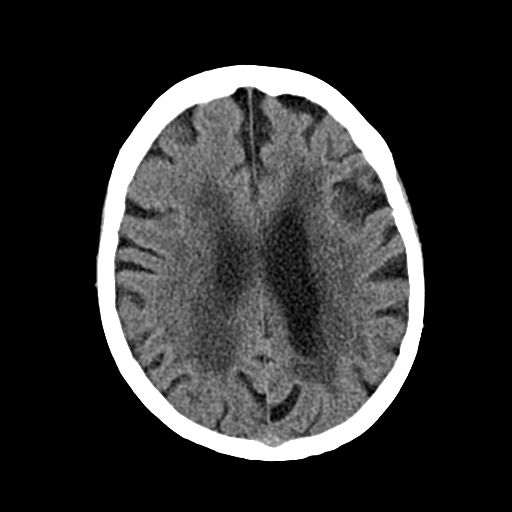
[im 19/31  bone]
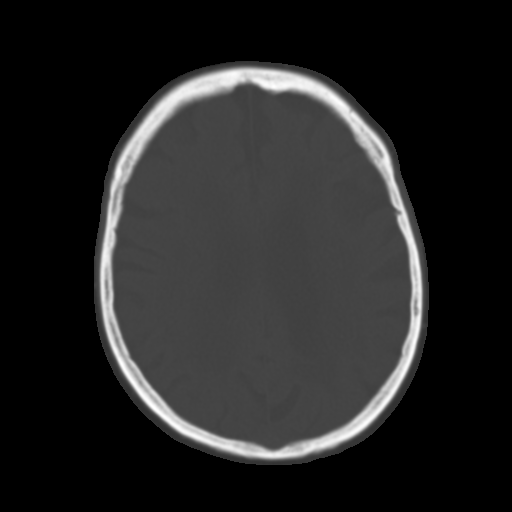
[im 23/31  brain]
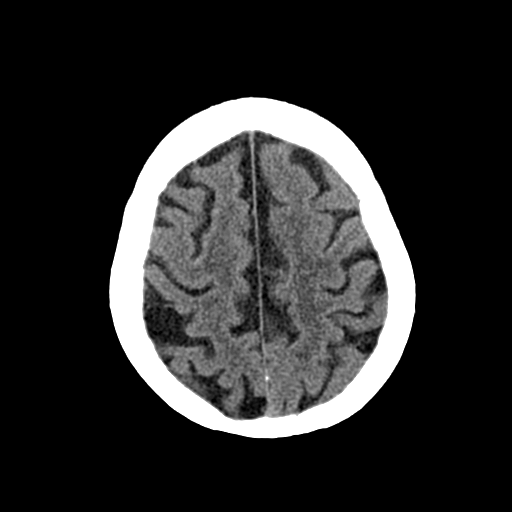
[im 27/31  brain]
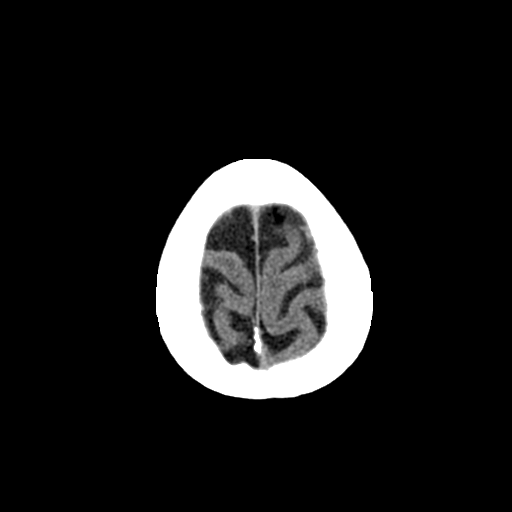

[Series 3: head bone · axial · 0.42mm/px · z∈[+37,+67]mm · 3 of 77 slices shown]
[im 8/77  bone]
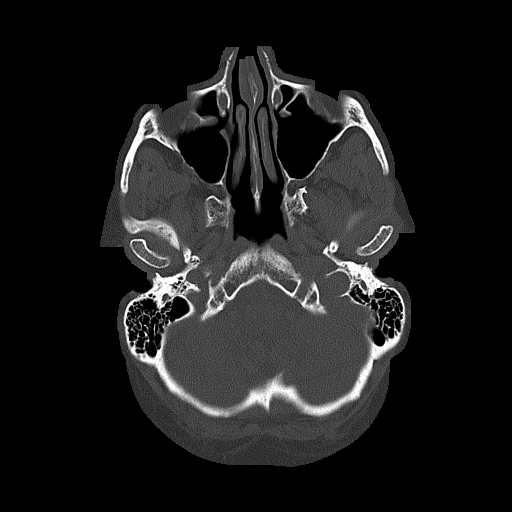
[im 16/77  bone]
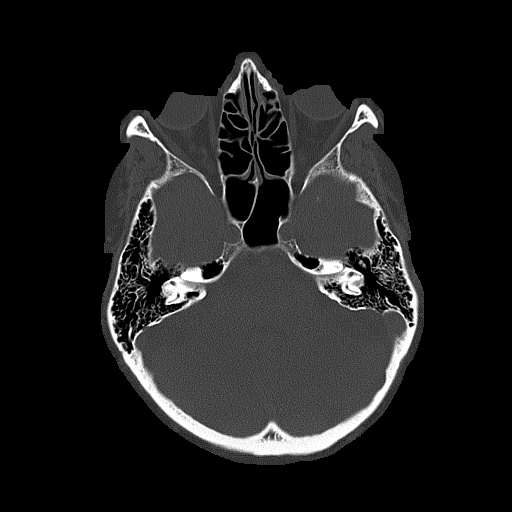
[im 23/77  bone]
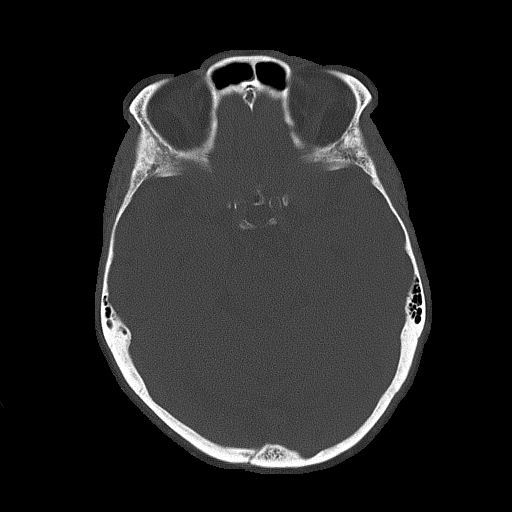

[Series 4: cor soft · coronal · 0.32mm/px · 3 of 71 slices shown]
[im 24/71  brain]
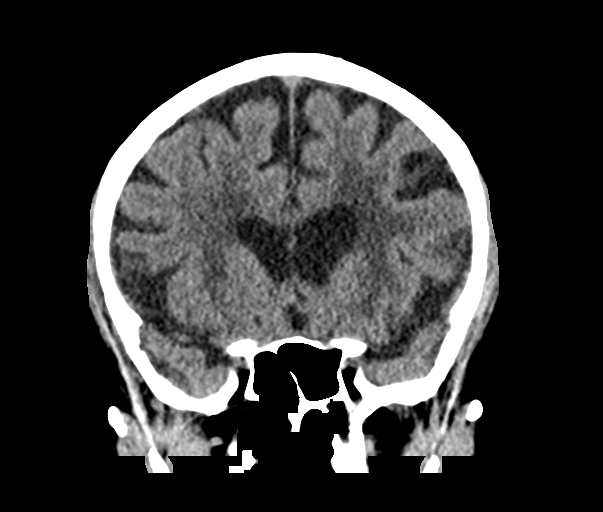
[im 32/71  brain]
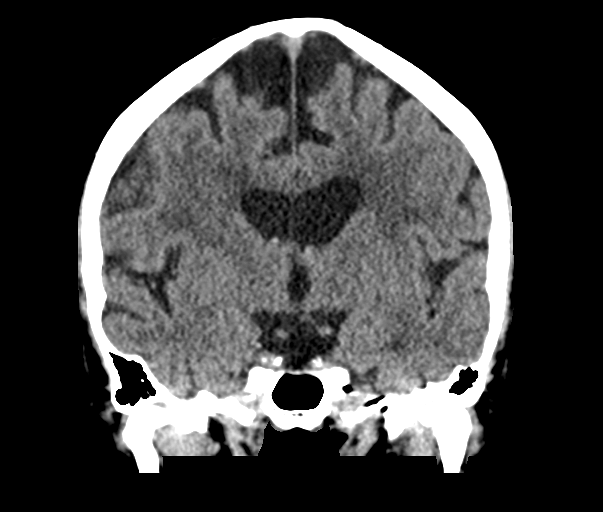
[im 39/71  brain]
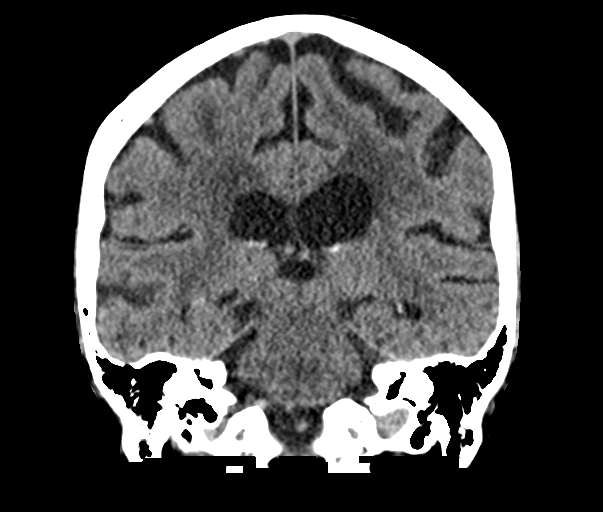

[Series 5: sag soft · sagittal · 0.30mm/px · 3 of 59 slices shown]
[im 20/59  brain]
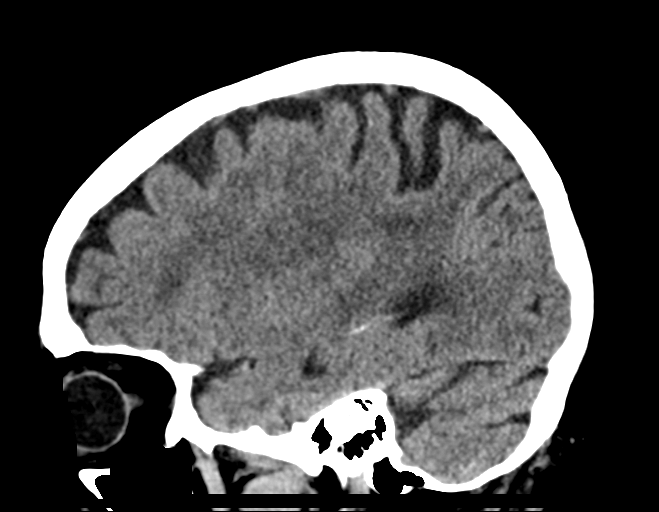
[im 30/59  brain]
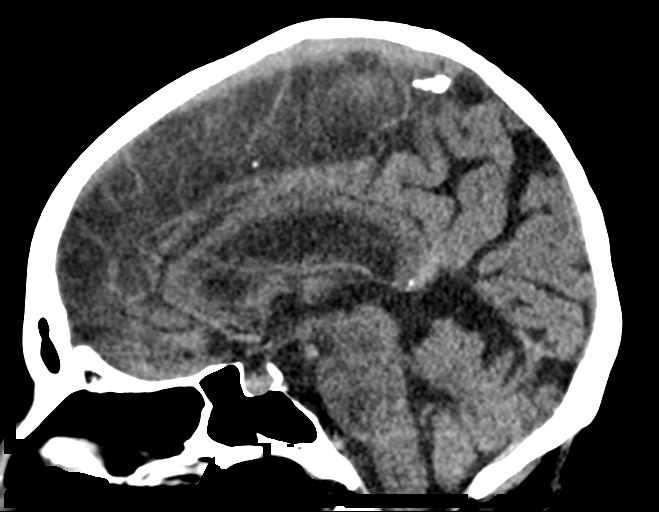
[im 39/59  brain]
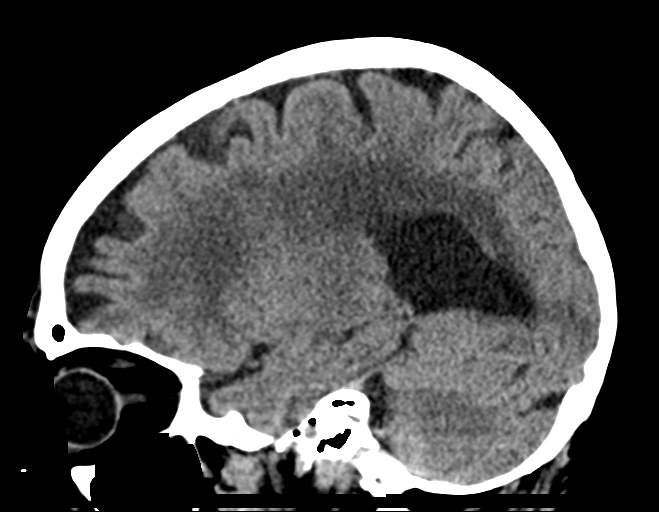

[16 of 47 positions shown; findings below may reference images not displayed]

FINDINGS: Brain:

Cerebral ventricle sizes are concordant with the degree of cerebral
volume loss.

Patchy and confluent areas of decreased attenuation are noted
throughout the deep and periventricular white matter of the cerebral
hemispheres bilaterally, compatible with chronic microvascular
ischemic disease.

No evidence of large-territorial acute infarction. No parenchymal
hemorrhage. No mass lesion. No extra-axial collection.

No mass effect or midline shift. No hydrocephalus. Basilar cisterns
are patent. Atherosclerotic calcifications are present within the
cavernous internal carotid arteries.

Vascular: No hyperdense vessel.

Skull: No acute fracture or focal lesion.

Sinuses/Orbits: Paranasal sinuses and mastoid air cells are clear.
Bilateral [place]le thinning. Otherwise the orbits are unremarkable.

Other: Left frontal subcutaneus soft tissue scalp edema with no
large hematoma formation.
IMPRESSION: No acute intracranial abnormality.

## 2022-11-15 ENCOUNTER — Encounter: Payer: Self-pay | Admitting: Adult Health

## 2022-11-15 ENCOUNTER — Ambulatory Visit (INDEPENDENT_AMBULATORY_CARE_PROVIDER_SITE_OTHER): Payer: Medicare Other | Admitting: Adult Health

## 2022-11-15 DIAGNOSIS — F32A Depression, unspecified: Secondary | ICD-10-CM | POA: Diagnosis not present

## 2022-11-15 DIAGNOSIS — F419 Anxiety disorder, unspecified: Secondary | ICD-10-CM | POA: Diagnosis not present

## 2022-11-15 DIAGNOSIS — F411 Generalized anxiety disorder: Secondary | ICD-10-CM

## 2022-11-15 MED ORDER — FLUOXETINE HCL 20 MG PO CAPS: ORAL_CAPSULE | ORAL | 5 refills | Status: DC

## 2022-11-15 MED ORDER — REXULTI 0.5 MG PO TABS: 0.5000 mg | ORAL_TABLET | Freq: Every day | ORAL | 5 refills | Status: DC

## 2022-11-15 NOTE — Progress Notes (Signed)
Ann Kim [id number] 04-07-1931 89 y.o.  Virtual Visit via Telephone Note  I connected with pt on 11/15/22 at 11:40 AM EDT by telephone and verified that I am speaking with the correct person using two identifiers.   I discussed the limitations, risks, security and privacy concerns of performing an evaluation and management service by telephone and the availability of in person appointments. I also discussed with the patient that there may be a patient responsible charge related to this service. The patient expressed understanding and agreed to proceed.   I discussed the assessment and treatment plan with the patient. The patient was provided an opportunity to ask questions and all were answered. The patient agreed with the plan and demonstrated an understanding of the instructions.   The patient was advised to call back or seek an in-person evaluation if the symptoms worsen or if the condition fails to improve as anticipated.  I provided 10 minutes of non-face-to-face time during this encounter.  The patient was located at home. The provider was located at [facility].   Dorothyann Gibbs, NP   Subjective:   Patient ID:  Ann Kim is a 89 y.o. (DOB 02-Feb-1932) female.  Chief Complaint: No chief complaint on file.   HPI Ann Kim presents for follow-up of depression and anxiety.  Describes mood today as "alright". Pleasant. Tearful at times. Mood symptoms - reports some depression - "not having as much interpersonal activity". Reports varying interest and motivation. Feels anxious at times - "not very often". Denies irritability. Denies worry, rumination, and over thinking. Has given up church responsibilities. Reports staying home more - "lacking the stamina to do the things I used to do". Mood is on the "lower" side. Stating "I feel like I'm doing ok". Reports both daughters are supportive. Feels like medications are helpful. Taking medications as prescribed.  Energy  levels lower. Active, but does not have a regular exercise routine. Retired.  Enjoys some usual interests and activities. Lives alone. Attends church. Has 2 daughters - grandchildren. Talking with family and friends.  Appetite adequate. Weight stable.   Sleeps well most night. Averages 6 to 8 hours.  Focus and concentration stable. Completing tasks. Manages aspects of household. Retired Runner, broadcasting/film/video.  Denies SI or HI.  Denies AH or VH. Denies substance use. Denies self harm.   Review of Systems:  Review of Systems  Musculoskeletal:  Negative for gait problem.  Neurological:  Negative for tremors.  Psychiatric/Behavioral:         Please refer to HPI    Medications: I have reviewed the patient's current medications.  Current Outpatient Medications  Medication Sig Dispense Refill   Brexpiprazole (REXULTI) 0.5 MG TABS Take 1 tablet (0.5 mg total) by mouth daily. 30 tablet 5   Cholecalciferol (VITAMIN D-1000 MAX ST) 25 MCG (1000 UT) tablet Take by mouth.     doxycycline (PERIOSTAT) 20 MG tablet TK 1 T PO BID     FLUoxetine (PROZAC) 20 MG capsule TAKE 1 CAPSULE BY MOUTH EVERY MORNING AND EVERY EVENING 60 capsule 5   ketoconazole (NIZORAL) 2 % cream Take as directed     levothyroxine (SYNTHROID) 88 MCG tablet TK 1 T PO D     LORazepam (ATIVAN) 0.5 MG tablet Take one tablet daily as needed for anxiety. 30 tablet 2   METRONIDAZOLE, TOPICAL, 0.75 % LOTN APPLY AND RUB IN A THIN FILM TO AFFECTED AREA BID.  QAM AND QPM     naproxen (NAPROSYN) 500 MG  tablet Take by mouth.     No current facility-administered medications for this visit.    Medication Side Effects: None  Allergies:  Allergies  Allergen Reactions   Codeine Nausea Only   Molds & Smuts Other (See Comments)   Pollen Extract Other (See Comments)   Penicillins Itching   Sulfa Antibiotics Itching    Past Medical History:  Diagnosis Date   Anxiety    Depression     No family history on file.  Social History    Socioeconomic History   Marital status: Widowed    Spouse name: Not on file   Number of children: Not on file   Years of education: Not on file   Highest education level: Not on file  Occupational History   Not on file  Tobacco Use   Smoking status: Never   Smokeless tobacco: Never  Substance and Sexual Activity   Alcohol use: Never   Drug use: Never   Sexual activity: Never  Other Topics Concern   Not on file  Social History Narrative   Not on file   Social Determinants of Health   Financial Resource Strain: Not on file  Food Insecurity: Not on file  Transportation Needs: Not on file  Physical Activity: Not on file  Stress: Not on file  Social Connections: Unknown (08/12/2022)   Received from [facility]   Social Network    Social Network: Not on file  Intimate Partner Violence: Unknown (08/12/2022)   Received from [facility]   HITS    Physically Hurt: Not on file    Insult or Talk Down To: Not on file    Threaten Physical Harm: Not on file    Scream or Curse: Not on file    Past Medical History, Surgical history, Social history, and Family history were reviewed and updated as appropriate.   Please see review of systems for further details on the patient's review from today.   Objective:   Physical Exam:  There were no vitals taken for this visit.  Physical Exam Constitutional:      General: She is not in acute distress. Musculoskeletal:        General: No deformity.  Neurological:     Mental Status: She is alert and oriented to person, place, and time.     Coordination: Coordination normal.  Psychiatric:        Attention and Perception: Attention and perception normal. She does not perceive auditory or visual hallucinations.        Mood and Affect: Mood normal. Mood is not anxious or depressed. Affect is not labile, blunt, angry or inappropriate.        Speech: Speech normal.        Behavior: Behavior normal.        Thought Content: Thought  content normal. Thought content is not paranoid or delusional. Thought content does not include homicidal or suicidal ideation. Thought content does not include homicidal or suicidal plan.        Cognition and Memory: Cognition and memory normal.        Judgment: Judgment normal.     Comments: Insight intact     Lab Review:     Component Value Date/Time   NA 138 02/21/2020 1834   K 4.7 02/21/2020 1834   CL 102 02/21/2020 1834   CO2 24 02/21/2020 1834   GLUCOSE 107 (H) 02/21/2020 1834   BUN 19 02/21/2020 1834   CREATININE 0.83 02/21/2020 1834   CALCIUM  9.4 02/21/2020 1834   PROT 6.8 02/21/2020 1834   ALBUMIN 3.8 02/21/2020 1834   AST 30 02/21/2020 1834   ALT 25 02/21/2020 1834   ALKPHOS 68 02/21/2020 1834   BILITOT 0.2 (L) 02/21/2020 1834   GFRNONAA >60 02/21/2020 1834       Component Value Date/Time   WBC 7.9 02/21/2020 1834   RBC 4.46 02/21/2020 1834   HGB 14.3 02/21/2020 1834   HCT 42.9 02/21/2020 1834   PLT 264 02/21/2020 1834   MCV 96.2 02/21/2020 1834   MCH 32.1 02/21/2020 1834   MCHC 33.3 02/21/2020 1834   RDW 13.4 02/21/2020 1834   LYMPHSABS 1.7 02/21/2020 1834   MONOABS 0.6 02/21/2020 1834   EOSABS 0.2 02/21/2020 1834   BASOSABS 0.0 02/21/2020 1834    No results found for: "POCLITH", "LITHIUM"   No results found for: "PHENYTOIN", "PHENOBARB", "VALPROATE", "CBMZ"   .res Assessment: Plan:    Plan:  Rexulti 0.5mg  - 1/2 tablet daily to one daily. Prozac 20mg  BID.  Ativan 0.5mg  daily prn anxiety - has used 2 to 3 times since last visit  Discussed potential metabolic side effects associated with atypical antipsychotics, as well as potential risk for movement side effects. Advised pt to contact office if movement side effects occur.    Discussed potential benefits, risk, and side effects of benzodiazepines to include potential risk of tolerance and dependence, as well as possible drowsiness.  Advised patient not to drive if experiencing drowsiness and to  take lowest possible effective dose to minimize risk of dependence and tolerance.  Patient advised to contact office with any questions, adverse effects, or acute worsening in signs and symptoms.  RTC 6 months   There are no diagnoses linked to this encounter.  Please see After Visit Summary for patient specific instructions.  Future Appointments  Date Time Provider Department Center  11/15/2022 11:40 AM Shakeisha Horine, Thereasa Solo, NP [facility] None    No orders of the defined types were placed in this encounter.     -------------------------------

## 2023-05-05 ENCOUNTER — Ambulatory Visit: Payer: Medicare Other | Admitting: Adult Health

## 2023-05-07 ENCOUNTER — Encounter: Payer: Self-pay | Admitting: Adult Health

## 2023-05-07 ENCOUNTER — Ambulatory Visit: Payer: Medicare Other | Admitting: Adult Health

## 2023-05-07 DIAGNOSIS — F419 Anxiety disorder, unspecified: Secondary | ICD-10-CM | POA: Diagnosis not present

## 2023-05-07 DIAGNOSIS — F32A Depression, unspecified: Secondary | ICD-10-CM | POA: Diagnosis not present

## 2023-05-07 DIAGNOSIS — F411 Generalized anxiety disorder: Secondary | ICD-10-CM

## 2023-05-07 MED ORDER — FLUOXETINE HCL 20 MG PO CAPS: ORAL_CAPSULE | ORAL | 5 refills | Status: DC

## 2023-05-07 NOTE — Progress Notes (Addendum)
 Ann Kim [id number] [birth year] 89 y.o.  Virtual Visit via Telephone Note  I connected with pt on 05/07/23 at 11:30 AM EST by telephone and verified that I am speaking with the correct person using two identifiers.   I discussed the limitations, risks, security and privacy concerns of performing an evaluation and management service by telephone and the availability of in person appointments. I also discussed with the patient that there may be a patient responsible charge related to this service. The patient expressed understanding and agreed to proceed.   I discussed the assessment and treatment plan with the patient. The patient was provided an opportunity to ask questions and all were answered. The patient agreed with the plan and demonstrated an understanding of the instructions.   The patient was advised to call back or seek an in-person evaluation if the symptoms worsen or if the condition fails to improve as anticipated.  I provided 15 minutes of non-face-to-face time during this encounter.  The patient was located at home.  The provider was located at [facility].   Ann Gibbs, NP   Subjective:   Patient ID:  Ann Kim is a 89 y.o. (DOB 1931-08-07) female.  Chief Complaint: No chief complaint on file.   HPI Ann Kim presents for follow-up of depression and anxiety.  Describes mood today as "ok". Pleasant. Denies tearfulness. Pleasant. Mood symptoms - reports some depression. Reports lower interest and motivation. Denies anxiety and irritability. Reports worry, rumination, and over thinking. Mood is stable. Stating "I feel like I'm doing ok". Feels like medications are helpful. Taking medications as prescribed.  Energy levels lower. Active, but does not have a regular exercise routine. Retired.  Enjoys some usual interests and activities. Lives alone. Attends church. Has 2 daughters local - grandchildren. Talking with family and friends.  Appetite  adequate. Weight stable.   Sleeps well most night. Averages 6 to 8 hours.  Focus and concentration stable. Completing tasks. Manages aspects of household. Retired Runner, broadcasting/film/video.  Denies SI or HI.  Denies AH or VH. Denies substance use. Denies self harm.  Review of Systems:  Review of Systems  Musculoskeletal:  Negative for gait problem.  Neurological:  Negative for tremors.  Psychiatric/Behavioral:         Please refer to HPI    Medications: I have reviewed the patient's current medications.  Current Outpatient Medications  Medication Sig Dispense Refill   Brexpiprazole (REXULTI) 0.5 MG TABS Take 1 tablet (0.5 mg total) by mouth daily. 30 tablet 5   Cholecalciferol (VITAMIN D-1000 MAX ST) 25 MCG (1000 UT) tablet Take by mouth.     doxycycline (PERIOSTAT) 20 MG tablet TK 1 T PO BID     FLUoxetine (PROZAC) 20 MG capsule TAKE 1 CAPSULE BY MOUTH EVERY MORNING AND EVERY EVENING 60 capsule 5   ketoconazole (NIZORAL) 2 % cream Take as directed     levothyroxine (SYNTHROID) 88 MCG tablet TK 1 T PO D     LORazepam (ATIVAN) 0.5 MG tablet Take one tablet daily as needed for anxiety. 30 tablet 2   METRONIDAZOLE, TOPICAL, 0.75 % LOTN APPLY AND RUB IN A THIN FILM TO AFFECTED AREA BID.  QAM AND QPM     naproxen (NAPROSYN) 500 MG tablet Take by mouth.     No current facility-administered medications for this visit.    Medication Side Effects: None  Allergies:  Allergies  Allergen Reactions   Codeine Nausea Only   Molds & Smuts Other (See  Comments)   Pollen Extract Other (See Comments)   Penicillins Itching   Sulfa Antibiotics Itching    Past Medical History:  Diagnosis Date   Anxiety    Depression     No family history on file.  Social History   Socioeconomic History   Marital status: Widowed    Spouse name: Not on file   Number of children: Not on file   Years of education: Not on file   Highest education level: Not on file  Occupational History   Not on file  Tobacco Use    Smoking status: Never   Smokeless tobacco: Never  Substance and Sexual Activity   Alcohol use: Never   Drug use: Never   Sexual activity: Never  Other Topics Concern   Not on file  Social History Narrative   Not on file   Social Drivers of Health   Financial Resource Strain: Not on file  Food Insecurity: Not on file  Transportation Needs: Not on file  Physical Activity: Not on file  Stress: Not on file  Social Connections: Unknown (08/12/2022)   Received from [facility]   Social Network    Social Network: Not on file  Intimate Partner Violence: Unknown (08/12/2022)   Received from [facility]   HITS    Physically Hurt: Not on file    Insult or Talk Down To: Not on file    Threaten Physical Harm: Not on file    Scream or Curse: Not on file    Past Medical History, Surgical history, Social history, and Family history were reviewed and updated as appropriate.   Please see review of systems for further details on the patient's review from today.   Objective:   Physical Exam:  There were no vitals taken for this visit.  Physical Exam Neurological:     Mental Status: She is alert and oriented to person, place, and time.     Cranial Nerves: No dysarthria.  Psychiatric:        Attention and Perception: Attention and perception normal.        Mood and Affect: Mood normal.        Speech: Speech normal.        Behavior: Behavior is cooperative.        Thought Content: Thought content normal. Thought content is not paranoid or delusional. Thought content does not include homicidal or suicidal ideation. Thought content does not include homicidal or suicidal plan.        Cognition and Memory: Cognition and memory normal.        Judgment: Judgment normal.     Comments: Insight intact     Lab Review:     Component Value Date/Time   NA 138 02/21/2020 1834   K 4.7 02/21/2020 1834   CL 102 02/21/2020 1834   CO2 24 02/21/2020 1834   GLUCOSE 107 (H) 02/21/2020 1834   BUN  19 02/21/2020 1834   CREATININE 0.83 02/21/2020 1834   CALCIUM 9.4 02/21/2020 1834   PROT 6.8 02/21/2020 1834   ALBUMIN 3.8 02/21/2020 1834   AST 30 02/21/2020 1834   ALT 25 02/21/2020 1834   ALKPHOS 68 02/21/2020 1834   BILITOT 0.2 (L) 02/21/2020 1834   GFRNONAA >60 02/21/2020 1834       Component Value Date/Time   WBC 7.9 02/21/2020 1834   RBC 4.46 02/21/2020 1834   HGB 14.3 02/21/2020 1834   HCT 42.9 02/21/2020 1834   PLT 264 02/21/2020 1834  MCV 96.2 02/21/2020 1834   MCH 32.1 02/21/2020 1834   MCHC 33.3 02/21/2020 1834   RDW 13.4 02/21/2020 1834   LYMPHSABS 1.7 02/21/2020 1834   MONOABS 0.6 02/21/2020 1834   EOSABS 0.2 02/21/2020 1834   BASOSABS 0.0 02/21/2020 1834    No results found for: "POCLITH", "LITHIUM"   No results found for: "PHENYTOIN", "PHENOBARB", "VALPROATE", "CBMZ"   .res Assessment: Plan:    Plan:  Prozac 20mg  BID.  Ativan 0.5mg  daily prn anxiety - has not taken in the past 6 months  Discussed potential benefits, risk, and side effects of benzodiazepines to include potential risk of tolerance and dependence, as well as possible drowsiness.  Advised patient not to drive if experiencing drowsiness and to take lowest possible effective dose to minimize risk of dependence and tolerance.  Patient advised to contact office with any questions, adverse effects, or acute worsening in signs and symptoms.  RTC 6 months  There are no diagnoses linked to this encounter.   Please see After Visit Summary for patient specific instructions.  Future Appointments  Date Time Provider Department Center  05/07/2023 11:30 AM Marcea Rojek, Thereasa Solo, NP [facility] None    No orders of the defined types were placed in this encounter.     -------------------------------

## 2023-11-14 ENCOUNTER — Telehealth: Payer: Self-pay | Admitting: Adult Health

## 2023-11-14 NOTE — Telephone Encounter (Signed)
 She was probably just checking on her appt date

## 2023-11-14 NOTE — Telephone Encounter (Signed)
 There is no message. Tried to call patient at the # listed and twice get message that call cannot be completed at this time. Called mobile # and LVM to RC.

## 2023-11-17 ENCOUNTER — Ambulatory Visit (INDEPENDENT_AMBULATORY_CARE_PROVIDER_SITE_OTHER): Admitting: Adult Health

## 2023-11-17 ENCOUNTER — Encounter: Payer: Self-pay | Admitting: Adult Health

## 2023-11-17 DIAGNOSIS — F32A Depression, unspecified: Secondary | ICD-10-CM | POA: Diagnosis not present

## 2023-11-17 DIAGNOSIS — F411 Generalized anxiety disorder: Secondary | ICD-10-CM

## 2023-11-17 MED ORDER — FLUOXETINE HCL 20 MG PO CAPS: ORAL_CAPSULE | ORAL | 5 refills | Status: DC

## 2023-11-17 NOTE — Progress Notes (Signed)
 Ann Kim [id number] October 29, 1931 89 y.o.  Subjective:   Patient ID:  Ann Kim is a 89 y.o. (DOB 1931/07/05) female.  Chief Complaint: No chief complaint on file.   HPI [name] presents to the office today for follow-up of depression and anxiety.  Describes mood today as ok. Pleasant. Denies tearfulness. Pleasant. Mood symptoms - reports some depression. Reports lower interest and motivation. Denies anxiety and irritability. Denies worry, rumination, and over thinking. Mood is stable. Stating I feel like I'm doing ok. Feels like medications are helpful - has been taking Prozac  20mg  - plans to take 40mg  daily. Taking medications as prescribed.  Energy levels lower. Active, but does not have a regular exercise routine. Retired.  Enjoys some usual interests and activities. Lives alone. Has 2 daughters local - grandchildren. Talking with family and friends.  Appetite adequate. Weight stable.   Sleeps well most night. Averages 6 to 8 hours. Reports daytime napping. Focus and concentration stable. Completing tasks. Manages aspects of household. Retired Runner, broadcasting/film/video.  Denies SI or HI.  Denies AH or VH. Denies substance use. Denies self harm.   Review of Systems:  Review of Systems  Musculoskeletal:  Negative for gait problem.  Neurological:  Negative for tremors.  Psychiatric/Behavioral:         Please refer to HPI    Medications: I have reviewed the patient's current medications.  Current Outpatient Medications  Medication Sig Dispense Refill   Cholecalciferol (VITAMIN D-1000 MAX ST) 25 MCG (1000 UT) tablet Take by mouth.     doxycycline (PERIOSTAT) 20 MG tablet TK 1 T PO BID     FLUoxetine  (PROZAC ) 20 MG capsule TAKE 1 CAPSULE BY MOUTH EVERY MORNING AND EVERY EVENING 60 capsule 5   ketoconazole (NIZORAL) 2 % cream Take as directed     levothyroxine (SYNTHROID) 88 MCG tablet TK 1 T PO D     LORazepam  (ATIVAN ) 0.5 MG tablet Take one tablet daily as needed for anxiety. 30  tablet 2   METRONIDAZOLE, TOPICAL, 0.75 % LOTN APPLY AND RUB IN A THIN FILM TO AFFECTED AREA BID.  QAM AND QPM     naproxen (NAPROSYN) 500 MG tablet Take by mouth.     No current facility-administered medications for this visit.    Medication Side Effects: None  Allergies:  Allergies  Allergen Reactions   Codeine Nausea Only   Molds & Smuts Other (See Comments)   Pollen Extract Other (See Comments)   Penicillins Itching   Sulfa Antibiotics Itching    Past Medical History:  Diagnosis Date   Anxiety    Depression     Past Medical History, Surgical history, Social history, and Family history were reviewed and updated as appropriate.   Please see review of systems for further details on the patient's review from today.   Objective:   Physical Exam:  There were no vitals taken for this visit.  Physical Exam Constitutional:      General: She is not in acute distress. Musculoskeletal:        General: No deformity.  Neurological:     Mental Status: She is alert and oriented to person, place, and time.     Coordination: Coordination normal.  Psychiatric:        Attention and Perception: Attention and perception normal. She does not perceive auditory or visual hallucinations.        Mood and Affect: Mood normal. Mood is not anxious or depressed. Affect is not labile, blunt, angry  or inappropriate.        Speech: Speech normal.        Behavior: Behavior normal.        Thought Content: Thought content normal. Thought content is not paranoid or delusional. Thought content does not include homicidal or suicidal ideation. Thought content does not include homicidal or suicidal plan.        Cognition and Memory: Cognition and memory normal.        Judgment: Judgment normal.     Comments: Insight intact     Lab Review:     Component Value Date/Time   NA 138 02/21/2020 1834   K 4.7 02/21/2020 1834   CL 102 02/21/2020 1834   CO2 24 02/21/2020 1834   GLUCOSE 107 (H) 02/21/2020  1834   BUN 19 02/21/2020 1834   CREATININE 0.83 02/21/2020 1834   CALCIUM 9.4 02/21/2020 1834   PROT 6.8 02/21/2020 1834   ALBUMIN 3.8 02/21/2020 1834   AST 30 02/21/2020 1834   ALT 25 02/21/2020 1834   ALKPHOS 68 02/21/2020 1834   BILITOT 0.2 (L) 02/21/2020 1834   GFRNONAA >60 02/21/2020 1834       Component Value Date/Time   WBC 7.9 02/21/2020 1834   RBC 4.46 02/21/2020 1834   HGB 14.3 02/21/2020 1834   HCT 42.9 02/21/2020 1834   PLT 264 02/21/2020 1834   MCV 96.2 02/21/2020 1834   MCH 32.1 02/21/2020 1834   MCHC 33.3 02/21/2020 1834   RDW 13.4 02/21/2020 1834   LYMPHSABS 1.7 02/21/2020 1834   MONOABS 0.6 02/21/2020 1834   EOSABS 0.2 02/21/2020 1834   BASOSABS 0.0 02/21/2020 1834    No results found for: POCLITH, LITHIUM   No results found for: PHENYTOIN, PHENOBARB, VALPROATE, CBMZ   .res Assessment: Plan:    Plan:  Prozac  20mg  BID.  Ativan  0.5mg  daily prn anxiety - has not taken in the past 6 months  Discussed potential benefits, risk, and side effects of benzodiazepines to include potential risk of tolerance and dependence, as well as possible drowsiness.  Advised patient not to drive if experiencing drowsiness and to take lowest possible effective dose to minimize risk of dependence and tolerance.  25 minutes spent dedicated to the care of this patient on the date of this encounter to include pre-visit review of records, ordering of medication, post visit documentation, and face-to-face time with the patient discussing depression and anxiety. Discussed continuing current medication regimen.  Patient advised to contact office with any questions, adverse effects, or acute worsening in signs and symptoms.  RTC 6 months  Diagnoses and all orders for this visit:  Depression, unspecified depression type -     FLUoxetine  (PROZAC ) 20 MG capsule; TAKE 1 CAPSULE BY MOUTH EVERY MORNING AND EVERY EVENING  GAD (generalized anxiety disorder) -      FLUoxetine  (PROZAC ) 20 MG capsule; TAKE 1 CAPSULE BY MOUTH EVERY MORNING AND EVERY EVENING     Please see After Visit Summary for patient specific instructions.  No future appointments.  No orders of the defined types were placed in this encounter.   -------------------------------

## 2023-11-17 NOTE — Addendum Note (Signed)
 Addended by: LAWERANCE ANGELINE SAILOR on: 11/17/2023 04:44 PM   Modules accepted: Level of Service

## 2024-03-22 ENCOUNTER — Other Ambulatory Visit: Payer: Self-pay

## 2024-03-22 DIAGNOSIS — F411 Generalized anxiety disorder: Secondary | ICD-10-CM

## 2024-03-22 DIAGNOSIS — F32A Depression, unspecified: Secondary | ICD-10-CM

## 2024-03-22 MED ORDER — FLUOXETINE HCL 20 MG PO CAPS: ORAL_CAPSULE | ORAL | 0 refills | Status: AC

## 2024-05-10 ENCOUNTER — Telehealth: Admitting: Adult Health
